# Patient Record
Sex: Female | Born: 1950 | Race: White | Hispanic: No | Marital: Married | Smoking: Never smoker
Health system: Southern US, Community
[De-identification: ages and names within clinical notes are randomized; demographics above are authoritative.]

## PROBLEM LIST (undated history)

## (undated) DIAGNOSIS — R251 Tremor, unspecified: Secondary | ICD-10-CM

## (undated) DIAGNOSIS — M1990 Unspecified osteoarthritis, unspecified site: Secondary | ICD-10-CM

## (undated) DIAGNOSIS — M722 Plantar fascial fibromatosis: Secondary | ICD-10-CM

## (undated) DIAGNOSIS — E782 Mixed hyperlipidemia: Secondary | ICD-10-CM

## (undated) DIAGNOSIS — G43909 Migraine, unspecified, not intractable, without status migrainosus: Secondary | ICD-10-CM

## (undated) DIAGNOSIS — Z1371 Encounter for nonprocreative screening for genetic disease carrier status: Secondary | ICD-10-CM

## (undated) HISTORY — DX: Plantar fascial fibromatosis: M72.2

## (undated) HISTORY — DX: Mixed hyperlipidemia: E78.2

## (undated) HISTORY — DX: Tremor, unspecified: R25.1

## (undated) HISTORY — PX: CATARACT EXTRACTION: SUR2

## (undated) HISTORY — PX: BREAST BIOPSY: SHX20

## (undated) HISTORY — DX: Unspecified osteoarthritis, unspecified site: M19.90

## (undated) HISTORY — DX: Migraine, unspecified, not intractable, without status migrainosus: G43.909

---
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
## 1981-03-11 HISTORY — PX: LAPAROSCOPY: SHX197

## 1997-08-25 ENCOUNTER — Other Ambulatory Visit: Admission: RE | Admit: 1997-08-25 | Discharge: 1997-08-25 | Payer: Self-pay | Admitting: Physician Assistant

## 1998-08-28 ENCOUNTER — Other Ambulatory Visit: Admission: RE | Admit: 1998-08-28 | Discharge: 1998-08-28 | Payer: Self-pay | Admitting: Obstetrics and Gynecology

## 1999-08-13 ENCOUNTER — Encounter: Admission: RE | Admit: 1999-08-13 | Discharge: 1999-08-13 | Payer: Self-pay | Admitting: Obstetrics and Gynecology

## 1999-08-13 ENCOUNTER — Encounter: Payer: Self-pay | Admitting: Obstetrics and Gynecology

## 1999-10-02 ENCOUNTER — Other Ambulatory Visit: Admission: RE | Admit: 1999-10-02 | Discharge: 1999-10-02 | Payer: Self-pay | Admitting: Obstetrics and Gynecology

## 1999-12-11 ENCOUNTER — Encounter: Payer: Self-pay | Admitting: Obstetrics and Gynecology

## 1999-12-11 ENCOUNTER — Encounter: Admission: RE | Admit: 1999-12-11 | Discharge: 1999-12-11 | Payer: Self-pay | Admitting: Obstetrics and Gynecology

## 2000-09-03 ENCOUNTER — Other Ambulatory Visit: Admission: RE | Admit: 2000-09-03 | Discharge: 2000-09-03 | Payer: Self-pay | Admitting: Radiology

## 2000-09-03 ENCOUNTER — Encounter: Payer: Self-pay | Admitting: Obstetrics and Gynecology

## 2000-09-03 ENCOUNTER — Encounter (INDEPENDENT_AMBULATORY_CARE_PROVIDER_SITE_OTHER): Payer: Self-pay | Admitting: *Deleted

## 2000-09-03 ENCOUNTER — Encounter: Admission: RE | Admit: 2000-09-03 | Discharge: 2000-09-03 | Payer: Self-pay | Admitting: Obstetrics and Gynecology

## 2000-10-29 ENCOUNTER — Other Ambulatory Visit: Admission: RE | Admit: 2000-10-29 | Discharge: 2000-10-29 | Payer: Self-pay | Admitting: Obstetrics and Gynecology

## 2001-11-16 ENCOUNTER — Other Ambulatory Visit: Admission: RE | Admit: 2001-11-16 | Discharge: 2001-11-16 | Payer: Self-pay | Admitting: Obstetrics and Gynecology

## 2001-12-18 ENCOUNTER — Encounter: Admission: RE | Admit: 2001-12-18 | Discharge: 2001-12-18 | Payer: Self-pay | Admitting: Obstetrics and Gynecology

## 2001-12-18 ENCOUNTER — Encounter: Payer: Self-pay | Admitting: Obstetrics and Gynecology

## 2003-02-08 ENCOUNTER — Encounter: Admission: RE | Admit: 2003-02-08 | Discharge: 2003-02-08 | Payer: Self-pay | Admitting: Obstetrics and Gynecology

## 2003-02-17 ENCOUNTER — Other Ambulatory Visit: Admission: RE | Admit: 2003-02-17 | Discharge: 2003-02-17 | Payer: Self-pay | Admitting: Obstetrics and Gynecology

## 2004-04-30 ENCOUNTER — Ambulatory Visit (HOSPITAL_COMMUNITY): Admission: RE | Admit: 2004-04-30 | Discharge: 2004-04-30 | Payer: Self-pay | Admitting: Gastroenterology

## 2004-09-04 ENCOUNTER — Other Ambulatory Visit: Admission: RE | Admit: 2004-09-04 | Discharge: 2004-09-04 | Payer: Self-pay | Admitting: Obstetrics and Gynecology

## 2004-09-20 ENCOUNTER — Encounter: Admission: RE | Admit: 2004-09-20 | Discharge: 2004-09-20 | Payer: Self-pay | Admitting: Obstetrics and Gynecology

## 2005-10-28 ENCOUNTER — Encounter: Admission: RE | Admit: 2005-10-28 | Discharge: 2005-10-28 | Payer: Self-pay | Admitting: Obstetrics and Gynecology

## 2006-12-08 ENCOUNTER — Encounter: Admission: RE | Admit: 2006-12-08 | Discharge: 2006-12-08 | Payer: Self-pay | Admitting: Obstetrics and Gynecology

## 2008-01-11 ENCOUNTER — Encounter: Admission: RE | Admit: 2008-01-11 | Discharge: 2008-01-11 | Payer: Self-pay | Admitting: Obstetrics and Gynecology

## 2009-02-15 ENCOUNTER — Encounter: Admission: RE | Admit: 2009-02-15 | Discharge: 2009-02-15 | Payer: Self-pay | Admitting: Obstetrics and Gynecology

## 2009-08-19 ENCOUNTER — Emergency Department (HOSPITAL_COMMUNITY): Admission: EM | Admit: 2009-08-19 | Discharge: 2009-08-19 | Payer: Self-pay | Admitting: Emergency Medicine

## 2010-03-31 ENCOUNTER — Other Ambulatory Visit: Payer: Self-pay | Admitting: Obstetrics and Gynecology

## 2010-03-31 DIAGNOSIS — Z0000 Encounter for general adult medical examination without abnormal findings: Secondary | ICD-10-CM

## 2010-04-12 ENCOUNTER — Ambulatory Visit
Admission: RE | Admit: 2010-04-12 | Discharge: 2010-04-12 | Disposition: A | Discharge status: Home / Self Care | Payer: PRIVATE HEALTH INSURANCE | Source: Ambulatory Visit | Attending: Obstetrics and Gynecology | Admitting: Obstetrics and Gynecology

## 2010-04-12 DIAGNOSIS — Z0000 Encounter for general adult medical examination without abnormal findings: Secondary | ICD-10-CM

## 2010-05-28 LAB — GLUCOSE, CAPILLARY: Glucose-Capillary: 95 mg/dL (ref 70–99)

## 2010-05-28 LAB — URINALYSIS, ROUTINE W REFLEX MICROSCOPIC
Bilirubin Urine: NEGATIVE
Glucose, UA: NEGATIVE mg/dL
Ketones, ur: NEGATIVE mg/dL
Nitrite: NEGATIVE
Protein, ur: NEGATIVE mg/dL
Specific Gravity, Urine: 1.019 (ref 1.005–1.030)

## 2010-05-28 LAB — POCT I-STAT, CHEM 8
Calcium, Ion: 1.23 mmol/L (ref 1.12–1.32)
Creatinine, Ser: 0.8 mg/dL (ref 0.4–1.2)
Glucose, Bld: 97 mg/dL (ref 70–99)
Hemoglobin: 13.6 g/dL (ref 12.0–15.0)
Sodium: 142 mEq/L (ref 135–145)

## 2010-07-27 NOTE — Op Note (Signed)
Sharon Miller, Sharon Miller               ACCOUNT NO.:  [id number]   MEDICAL RECORD NO.:  [id number]          PATIENT TYPE:  AMB   LOCATION:  ENDO                         FACILITY:  [facility]   PHYSICIAN:  Bernette Redbird, M.D.   DATE OF BIRTH:  1950/03/31   DATE OF PROCEDURE:  04/30/2004  DATE OF DISCHARGE:                                 OPERATIVE REPORT   PROCEDURE:  Colonoscopy.   ENDOSCOPIST:  Bernette Redbird, M.D.   INDICATION:  Followup of a solitary small adenomatous polyp removed  approximately seven years ago in a 60 year old female without worrisome  symptoms.   FINDINGS:  Sigmoid diverticulosis with mild fixation.   PROCEDURE:  The nature, purpose, and risks of the procedure were familiar to  the patient from prior examination, and she provided written consent.  Sedation was fentanyl 50 mcg and Versed 7 mg IV plus Phenergan 12.5 mg IV  because of a prior history of some nausea following surgical procedures.   The Olympus adjustable-tension pediatric video colonoscope was advanced to  the cecum without undue difficulty, using some external abdominal  compression to control looping.  The cecum was identified by visualization  of the appendiceal orifice, and pullback was then performed.  The quality of  the prep was very good, and it is felt that all areas were well seen.   In the sigmoid region, there was some diverticular change with perhaps a  little bit of fixation and muscular thickening, but no polyps, cancer,  colitis, or vascular malformations were seen in the colon. Retroflexion the  rectum and reinspection of the rectum were unremarkable.  No biopsies were  obtained.  She tolerated the procedure well, and there no apparent  complications.   IMPRESSION:  Prior history of colonic adenoma without worrisome findings on  current examination (V12.72).   PLAN:  Repeat colonoscopy in five years in view of the prior history of an  adenoma having been removed.      RB/MEDQ   D:  04/30/2004  T:  04/30/2004  Job:  [id number]   cc:   Talmadge Coventry, M.D.  [address]  [place]  [place] [address]  Fax: [phone]   Juluis Mire, M.D.  [address] [place]  [place] [address]  Fax: [phone]

## 2011-05-01 ENCOUNTER — Other Ambulatory Visit: Payer: Self-pay | Admitting: Obstetrics and Gynecology

## 2011-05-01 DIAGNOSIS — Z1231 Encounter for screening mammogram for malignant neoplasm of breast: Secondary | ICD-10-CM

## 2011-05-28 ENCOUNTER — Ambulatory Visit
Admission: RE | Admit: 2011-05-28 | Discharge: 2011-05-28 | Disposition: A | Discharge status: Home / Self Care | Payer: PRIVATE HEALTH INSURANCE | Source: Ambulatory Visit | Attending: Obstetrics and Gynecology | Admitting: Obstetrics and Gynecology

## 2011-05-28 DIAGNOSIS — Z1231 Encounter for screening mammogram for malignant neoplasm of breast: Secondary | ICD-10-CM

## 2012-06-02 ENCOUNTER — Other Ambulatory Visit: Payer: Self-pay

## 2012-06-02 DIAGNOSIS — Z1231 Encounter for screening mammogram for malignant neoplasm of breast: Secondary | ICD-10-CM

## 2012-06-23 ENCOUNTER — Ambulatory Visit
Admission: RE | Admit: 2012-06-23 | Discharge: 2012-06-23 | Disposition: A | Discharge status: Home / Self Care | Payer: 59 | Source: Ambulatory Visit

## 2012-06-23 DIAGNOSIS — Z1231 Encounter for screening mammogram for malignant neoplasm of breast: Secondary | ICD-10-CM

## 2013-06-29 ENCOUNTER — Other Ambulatory Visit: Payer: Self-pay

## 2013-06-29 DIAGNOSIS — Z1231 Encounter for screening mammogram for malignant neoplasm of breast: Secondary | ICD-10-CM

## 2013-07-13 ENCOUNTER — Ambulatory Visit
Admission: RE | Admit: 2013-07-13 | Discharge: 2013-07-13 | Disposition: A | Discharge status: Home / Self Care | Payer: 59 | Source: Ambulatory Visit

## 2013-07-13 ENCOUNTER — Encounter (INDEPENDENT_AMBULATORY_CARE_PROVIDER_SITE_OTHER): Payer: Self-pay

## 2013-07-13 DIAGNOSIS — Z1231 Encounter for screening mammogram for malignant neoplasm of breast: Secondary | ICD-10-CM

## 2014-07-29 ENCOUNTER — Other Ambulatory Visit: Payer: Self-pay

## 2014-07-29 DIAGNOSIS — Z1231 Encounter for screening mammogram for malignant neoplasm of breast: Secondary | ICD-10-CM

## 2014-08-30 ENCOUNTER — Ambulatory Visit
Admission: RE | Admit: 2014-08-30 | Discharge: 2014-08-30 | Disposition: A | Discharge status: Home / Self Care | Payer: 59 | Source: Ambulatory Visit

## 2014-08-30 DIAGNOSIS — Z1231 Encounter for screening mammogram for malignant neoplasm of breast: Secondary | ICD-10-CM

## 2015-04-24 ENCOUNTER — Encounter (HOSPITAL_COMMUNITY): Payer: 59

## 2015-08-09 ENCOUNTER — Other Ambulatory Visit: Payer: Self-pay | Admitting: Obstetrics and Gynecology

## 2015-08-09 ENCOUNTER — Other Ambulatory Visit: Payer: Self-pay

## 2015-08-09 DIAGNOSIS — Z1231 Encounter for screening mammogram for malignant neoplasm of breast: Secondary | ICD-10-CM

## 2015-09-05 ENCOUNTER — Ambulatory Visit: Payer: 59

## 2015-09-19 ENCOUNTER — Ambulatory Visit: Payer: 59

## 2015-10-04 ENCOUNTER — Ambulatory Visit
Admission: RE | Admit: 2015-10-04 | Discharge: 2015-10-04 | Disposition: A | Discharge status: Home / Self Care | Payer: 59 | Source: Ambulatory Visit | Attending: Obstetrics and Gynecology | Admitting: Obstetrics and Gynecology

## 2015-10-04 DIAGNOSIS — Z1231 Encounter for screening mammogram for malignant neoplasm of breast: Secondary | ICD-10-CM

## 2016-12-23 DIAGNOSIS — Z23 Encounter for immunization: Secondary | ICD-10-CM | POA: Diagnosis not present

## 2017-03-24 DIAGNOSIS — H2513 Age-related nuclear cataract, bilateral: Secondary | ICD-10-CM | POA: Diagnosis not present

## 2017-04-03 DIAGNOSIS — Z0000 Encounter for general adult medical examination without abnormal findings: Secondary | ICD-10-CM | POA: Diagnosis not present

## 2017-04-03 DIAGNOSIS — M1990 Unspecified osteoarthritis, unspecified site: Secondary | ICD-10-CM | POA: Diagnosis not present

## 2017-04-03 DIAGNOSIS — Z23 Encounter for immunization: Secondary | ICD-10-CM | POA: Diagnosis not present

## 2017-04-03 DIAGNOSIS — E663 Overweight: Secondary | ICD-10-CM | POA: Diagnosis not present

## 2017-04-03 DIAGNOSIS — Z79899 Other long term (current) drug therapy: Secondary | ICD-10-CM | POA: Diagnosis not present

## 2017-04-03 DIAGNOSIS — L570 Actinic keratosis: Secondary | ICD-10-CM | POA: Diagnosis not present

## 2017-04-03 DIAGNOSIS — E782 Mixed hyperlipidemia: Secondary | ICD-10-CM | POA: Diagnosis not present

## 2017-04-03 DIAGNOSIS — Z8669 Personal history of other diseases of the nervous system and sense organs: Secondary | ICD-10-CM | POA: Diagnosis not present

## 2017-04-03 DIAGNOSIS — G250 Essential tremor: Secondary | ICD-10-CM | POA: Diagnosis not present

## 2017-04-03 DIAGNOSIS — Z6828 Body mass index (BMI) 28.0-28.9, adult: Secondary | ICD-10-CM | POA: Diagnosis not present

## 2017-04-08 ENCOUNTER — Other Ambulatory Visit: Payer: Self-pay | Admitting: Obstetrics and Gynecology

## 2017-04-08 DIAGNOSIS — Z1231 Encounter for screening mammogram for malignant neoplasm of breast: Secondary | ICD-10-CM

## 2017-04-29 ENCOUNTER — Ambulatory Visit
Admission: RE | Admit: 2017-04-29 | Discharge: 2017-04-29 | Disposition: A | Discharge status: Home / Self Care | Payer: Medicare Other | Source: Ambulatory Visit | Attending: Obstetrics and Gynecology | Admitting: Obstetrics and Gynecology

## 2017-04-29 DIAGNOSIS — Z1231 Encounter for screening mammogram for malignant neoplasm of breast: Secondary | ICD-10-CM | POA: Diagnosis not present

## 2017-05-05 DIAGNOSIS — Z124 Encounter for screening for malignant neoplasm of cervix: Secondary | ICD-10-CM | POA: Diagnosis not present

## 2017-05-05 DIAGNOSIS — Z779 Other contact with and (suspected) exposures hazardous to health: Secondary | ICD-10-CM | POA: Diagnosis not present

## 2017-05-05 DIAGNOSIS — N3281 Overactive bladder: Secondary | ICD-10-CM | POA: Diagnosis not present

## 2017-05-05 DIAGNOSIS — Z6828 Body mass index (BMI) 28.0-28.9, adult: Secondary | ICD-10-CM | POA: Diagnosis not present

## 2017-06-16 DIAGNOSIS — D225 Melanocytic nevi of trunk: Secondary | ICD-10-CM | POA: Diagnosis not present

## 2017-06-16 DIAGNOSIS — D1801 Hemangioma of skin and subcutaneous tissue: Secondary | ICD-10-CM | POA: Diagnosis not present

## 2017-06-16 DIAGNOSIS — L821 Other seborrheic keratosis: Secondary | ICD-10-CM | POA: Diagnosis not present

## 2017-06-26 ENCOUNTER — Other Ambulatory Visit: Payer: Self-pay

## 2017-06-26 DIAGNOSIS — R05 Cough: Secondary | ICD-10-CM | POA: Diagnosis not present

## 2017-11-24 DIAGNOSIS — Z23 Encounter for immunization: Secondary | ICD-10-CM | POA: Diagnosis not present

## 2017-12-24 DIAGNOSIS — M5412 Radiculopathy, cervical region: Secondary | ICD-10-CM | POA: Diagnosis not present

## 2017-12-24 DIAGNOSIS — M5416 Radiculopathy, lumbar region: Secondary | ICD-10-CM | POA: Diagnosis not present

## 2018-01-28 DIAGNOSIS — N958 Other specified menopausal and perimenopausal disorders: Secondary | ICD-10-CM | POA: Diagnosis not present

## 2018-04-06 ENCOUNTER — Other Ambulatory Visit: Payer: Self-pay | Admitting: Obstetrics and Gynecology

## 2018-04-06 DIAGNOSIS — Z1231 Encounter for screening mammogram for malignant neoplasm of breast: Secondary | ICD-10-CM

## 2018-05-05 ENCOUNTER — Ambulatory Visit
Admission: RE | Admit: 2018-05-05 | Discharge: 2018-05-05 | Disposition: A | Discharge status: Home / Self Care | Payer: Medicare Other | Source: Ambulatory Visit | Attending: Obstetrics and Gynecology | Admitting: Obstetrics and Gynecology

## 2018-05-05 DIAGNOSIS — Z1231 Encounter for screening mammogram for malignant neoplasm of breast: Secondary | ICD-10-CM

## 2018-05-06 ENCOUNTER — Other Ambulatory Visit: Payer: Self-pay | Admitting: Obstetrics and Gynecology

## 2018-05-06 DIAGNOSIS — R928 Other abnormal and inconclusive findings on diagnostic imaging of breast: Secondary | ICD-10-CM

## 2018-05-07 DIAGNOSIS — Z86010 Personal history of colonic polyps: Secondary | ICD-10-CM | POA: Diagnosis not present

## 2018-05-07 DIAGNOSIS — Z0000 Encounter for general adult medical examination without abnormal findings: Secondary | ICD-10-CM | POA: Diagnosis not present

## 2018-05-07 DIAGNOSIS — G250 Essential tremor: Secondary | ICD-10-CM | POA: Diagnosis not present

## 2018-05-07 DIAGNOSIS — R8290 Unspecified abnormal findings in urine: Secondary | ICD-10-CM | POA: Diagnosis not present

## 2018-05-07 DIAGNOSIS — Z79899 Other long term (current) drug therapy: Secondary | ICD-10-CM | POA: Diagnosis not present

## 2018-05-07 DIAGNOSIS — Z1159 Encounter for screening for other viral diseases: Secondary | ICD-10-CM | POA: Diagnosis not present

## 2018-05-07 DIAGNOSIS — L570 Actinic keratosis: Secondary | ICD-10-CM | POA: Diagnosis not present

## 2018-05-07 DIAGNOSIS — Z8669 Personal history of other diseases of the nervous system and sense organs: Secondary | ICD-10-CM | POA: Diagnosis not present

## 2018-05-07 DIAGNOSIS — M1990 Unspecified osteoarthritis, unspecified site: Secondary | ICD-10-CM | POA: Diagnosis not present

## 2018-05-07 DIAGNOSIS — E782 Mixed hyperlipidemia: Secondary | ICD-10-CM | POA: Diagnosis not present

## 2018-05-11 ENCOUNTER — Ambulatory Visit
Admission: RE | Admit: 2018-05-11 | Discharge: 2018-05-11 | Disposition: A | Discharge status: Home / Self Care | Payer: Medicare Other | Source: Ambulatory Visit | Attending: Obstetrics and Gynecology | Admitting: Obstetrics and Gynecology

## 2018-05-11 ENCOUNTER — Ambulatory Visit: Payer: Medicare Other

## 2018-05-11 DIAGNOSIS — R928 Other abnormal and inconclusive findings on diagnostic imaging of breast: Secondary | ICD-10-CM

## 2018-05-12 DIAGNOSIS — Z01419 Encounter for gynecological examination (general) (routine) without abnormal findings: Secondary | ICD-10-CM | POA: Diagnosis not present

## 2018-05-12 DIAGNOSIS — Z6828 Body mass index (BMI) 28.0-28.9, adult: Secondary | ICD-10-CM | POA: Diagnosis not present

## 2018-05-12 DIAGNOSIS — Z124 Encounter for screening for malignant neoplasm of cervix: Secondary | ICD-10-CM | POA: Diagnosis not present

## 2018-11-29 DIAGNOSIS — Z23 Encounter for immunization: Secondary | ICD-10-CM | POA: Diagnosis not present

## 2018-12-24 DIAGNOSIS — H2513 Age-related nuclear cataract, bilateral: Secondary | ICD-10-CM | POA: Diagnosis not present

## 2019-01-04 DIAGNOSIS — D225 Melanocytic nevi of trunk: Secondary | ICD-10-CM | POA: Diagnosis not present

## 2019-01-04 DIAGNOSIS — Z1283 Encounter for screening for malignant neoplasm of skin: Secondary | ICD-10-CM | POA: Diagnosis not present

## 2019-04-22 ENCOUNTER — Ambulatory Visit: Payer: Medicare Other

## 2019-05-04 DIAGNOSIS — E782 Mixed hyperlipidemia: Secondary | ICD-10-CM | POA: Diagnosis not present

## 2019-05-04 DIAGNOSIS — M1990 Unspecified osteoarthritis, unspecified site: Secondary | ICD-10-CM | POA: Diagnosis not present

## 2019-05-21 DIAGNOSIS — E782 Mixed hyperlipidemia: Secondary | ICD-10-CM | POA: Diagnosis not present

## 2019-05-21 DIAGNOSIS — Z0000 Encounter for general adult medical examination without abnormal findings: Secondary | ICD-10-CM | POA: Diagnosis not present

## 2019-05-21 DIAGNOSIS — M1990 Unspecified osteoarthritis, unspecified site: Secondary | ICD-10-CM | POA: Diagnosis not present

## 2019-05-21 DIAGNOSIS — R8290 Unspecified abnormal findings in urine: Secondary | ICD-10-CM | POA: Diagnosis not present

## 2019-05-21 DIAGNOSIS — Z8669 Personal history of other diseases of the nervous system and sense organs: Secondary | ICD-10-CM | POA: Diagnosis not present

## 2019-05-21 DIAGNOSIS — L570 Actinic keratosis: Secondary | ICD-10-CM | POA: Diagnosis not present

## 2019-05-21 DIAGNOSIS — G250 Essential tremor: Secondary | ICD-10-CM | POA: Diagnosis not present

## 2019-05-21 DIAGNOSIS — Z79899 Other long term (current) drug therapy: Secondary | ICD-10-CM | POA: Diagnosis not present

## 2019-05-21 DIAGNOSIS — Z86010 Personal history of colonic polyps: Secondary | ICD-10-CM | POA: Diagnosis not present

## 2019-06-21 DIAGNOSIS — R350 Frequency of micturition: Secondary | ICD-10-CM | POA: Diagnosis not present

## 2019-06-21 DIAGNOSIS — Z01419 Encounter for gynecological examination (general) (routine) without abnormal findings: Secondary | ICD-10-CM | POA: Diagnosis not present

## 2019-06-21 DIAGNOSIS — N390 Urinary tract infection, site not specified: Secondary | ICD-10-CM | POA: Diagnosis not present

## 2019-06-21 DIAGNOSIS — Z779 Other contact with and (suspected) exposures hazardous to health: Secondary | ICD-10-CM | POA: Diagnosis not present

## 2019-06-21 DIAGNOSIS — Z6828 Body mass index (BMI) 28.0-28.9, adult: Secondary | ICD-10-CM | POA: Diagnosis not present

## 2019-06-22 ENCOUNTER — Other Ambulatory Visit: Payer: Self-pay | Admitting: Obstetrics and Gynecology

## 2019-06-22 DIAGNOSIS — Z1231 Encounter for screening mammogram for malignant neoplasm of breast: Secondary | ICD-10-CM

## 2019-07-22 ENCOUNTER — Ambulatory Visit
Admission: RE | Admit: 2019-07-22 | Discharge: 2019-07-22 | Disposition: A | Discharge status: Home / Self Care | Payer: Medicare Other | Source: Ambulatory Visit | Attending: Obstetrics and Gynecology | Admitting: Obstetrics and Gynecology

## 2019-07-22 ENCOUNTER — Other Ambulatory Visit: Payer: Self-pay

## 2019-07-22 DIAGNOSIS — Z1231 Encounter for screening mammogram for malignant neoplasm of breast: Secondary | ICD-10-CM

## 2019-08-11 DIAGNOSIS — R350 Frequency of micturition: Secondary | ICD-10-CM | POA: Diagnosis not present

## 2019-08-11 DIAGNOSIS — R351 Nocturia: Secondary | ICD-10-CM | POA: Diagnosis not present

## 2019-08-11 DIAGNOSIS — R3914 Feeling of incomplete bladder emptying: Secondary | ICD-10-CM | POA: Diagnosis not present

## 2019-08-11 DIAGNOSIS — N393 Stress incontinence (female) (male): Secondary | ICD-10-CM | POA: Diagnosis not present

## 2019-08-11 DIAGNOSIS — N952 Postmenopausal atrophic vaginitis: Secondary | ICD-10-CM | POA: Diagnosis not present

## 2019-09-22 DIAGNOSIS — R350 Frequency of micturition: Secondary | ICD-10-CM | POA: Diagnosis not present

## 2019-09-22 DIAGNOSIS — R3914 Feeling of incomplete bladder emptying: Secondary | ICD-10-CM | POA: Diagnosis not present

## 2019-09-22 DIAGNOSIS — R351 Nocturia: Secondary | ICD-10-CM | POA: Diagnosis not present

## 2019-09-22 DIAGNOSIS — M6281 Muscle weakness (generalized): Secondary | ICD-10-CM | POA: Diagnosis not present

## 2019-09-22 DIAGNOSIS — M62838 Other muscle spasm: Secondary | ICD-10-CM | POA: Diagnosis not present

## 2019-09-22 DIAGNOSIS — M6289 Other specified disorders of muscle: Secondary | ICD-10-CM | POA: Diagnosis not present

## 2019-10-08 DIAGNOSIS — S20212A Contusion of left front wall of thorax, initial encounter: Secondary | ICD-10-CM | POA: Diagnosis not present

## 2019-10-08 DIAGNOSIS — R0781 Pleurodynia: Secondary | ICD-10-CM | POA: Diagnosis not present

## 2019-10-08 DIAGNOSIS — S299XXA Unspecified injury of thorax, initial encounter: Secondary | ICD-10-CM | POA: Diagnosis not present

## 2019-10-20 DIAGNOSIS — R3914 Feeling of incomplete bladder emptying: Secondary | ICD-10-CM | POA: Diagnosis not present

## 2019-10-20 DIAGNOSIS — R350 Frequency of micturition: Secondary | ICD-10-CM | POA: Diagnosis not present

## 2019-10-20 DIAGNOSIS — M62838 Other muscle spasm: Secondary | ICD-10-CM | POA: Diagnosis not present

## 2019-10-20 DIAGNOSIS — R3915 Urgency of urination: Secondary | ICD-10-CM | POA: Diagnosis not present

## 2019-10-20 DIAGNOSIS — N393 Stress incontinence (female) (male): Secondary | ICD-10-CM | POA: Diagnosis not present

## 2019-10-20 DIAGNOSIS — R3911 Hesitancy of micturition: Secondary | ICD-10-CM | POA: Diagnosis not present

## 2019-10-20 DIAGNOSIS — M6289 Other specified disorders of muscle: Secondary | ICD-10-CM | POA: Diagnosis not present

## 2019-10-20 DIAGNOSIS — M6281 Muscle weakness (generalized): Secondary | ICD-10-CM | POA: Diagnosis not present

## 2019-11-17 DIAGNOSIS — N393 Stress incontinence (female) (male): Secondary | ICD-10-CM | POA: Diagnosis not present

## 2019-11-17 DIAGNOSIS — R3914 Feeling of incomplete bladder emptying: Secondary | ICD-10-CM | POA: Diagnosis not present

## 2019-11-17 DIAGNOSIS — R8271 Bacteriuria: Secondary | ICD-10-CM | POA: Diagnosis not present

## 2019-12-08 DIAGNOSIS — Z23 Encounter for immunization: Secondary | ICD-10-CM | POA: Diagnosis not present

## 2019-12-17 IMAGING — MG DIGITAL DIAGNOSTIC UNILATERAL LEFT MAMMOGRAM WITH TOMO AND CAD
4 series · 4 of 12 positions shown · non-contrast
Comparison: Previous exam(s).

CLINICAL DATA: Callback from screening mammogram for possible
asymmetry left breast.

EXAM:
DIGITAL DIAGNOSTIC UNILATERAL LEFT MAMMOGRAM WITH CAD AND TOMO

[L CC synth-2D]
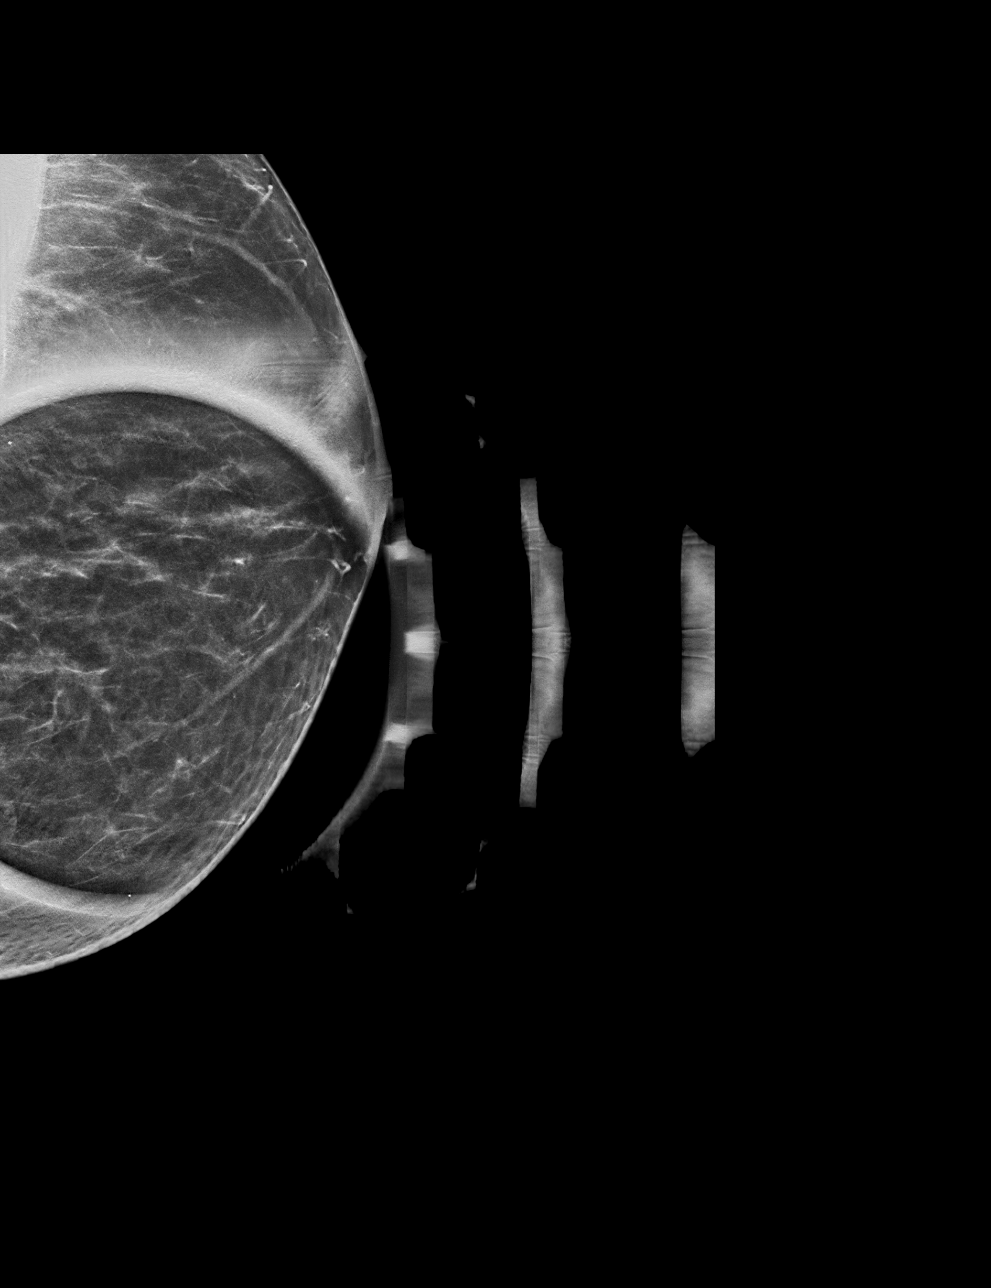

[L ML synth-2D]
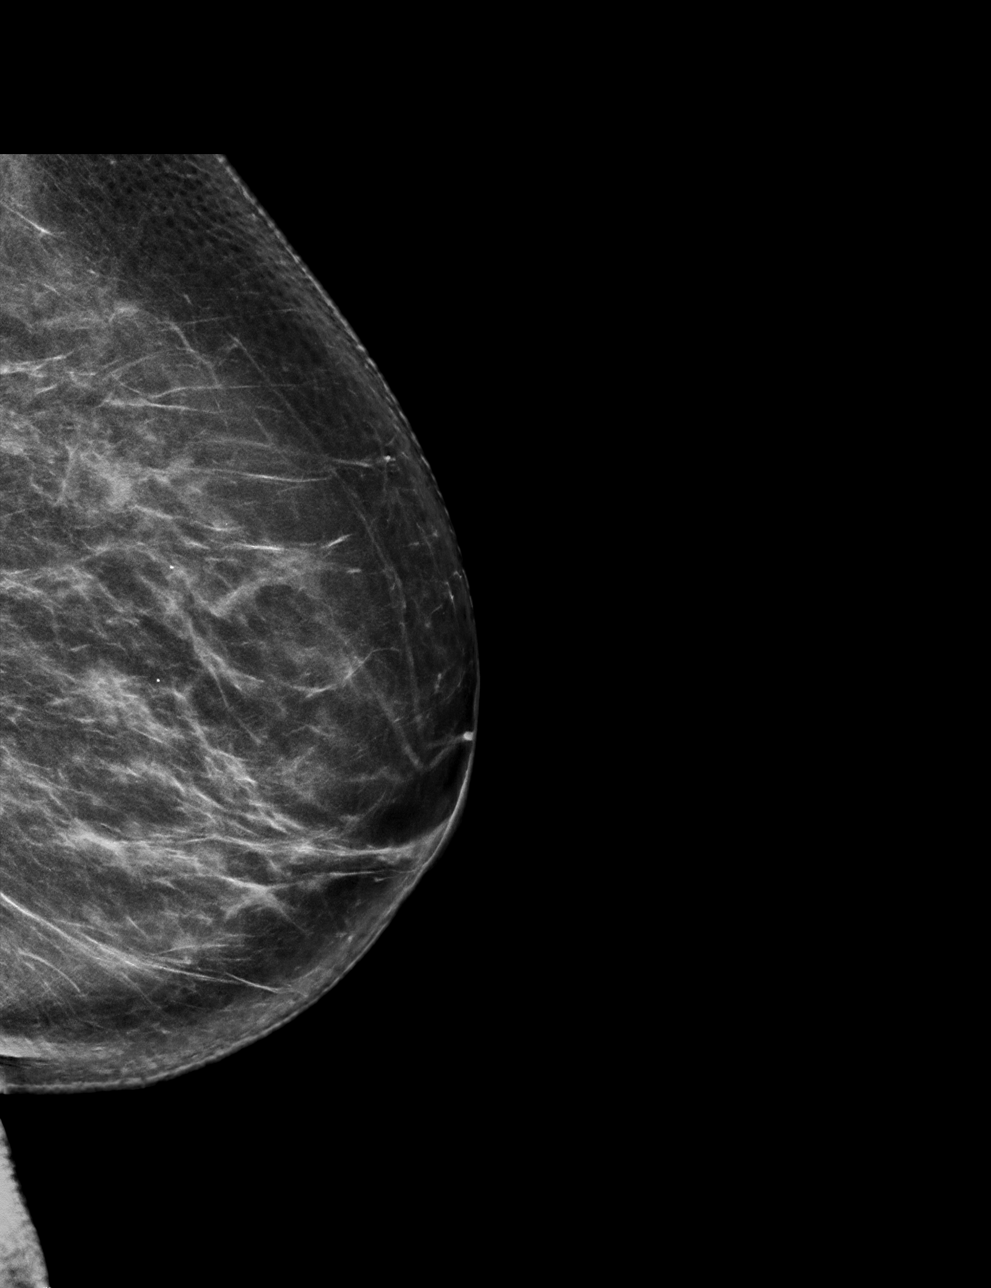

[L ML tomo · tomo slice 41/81.0]
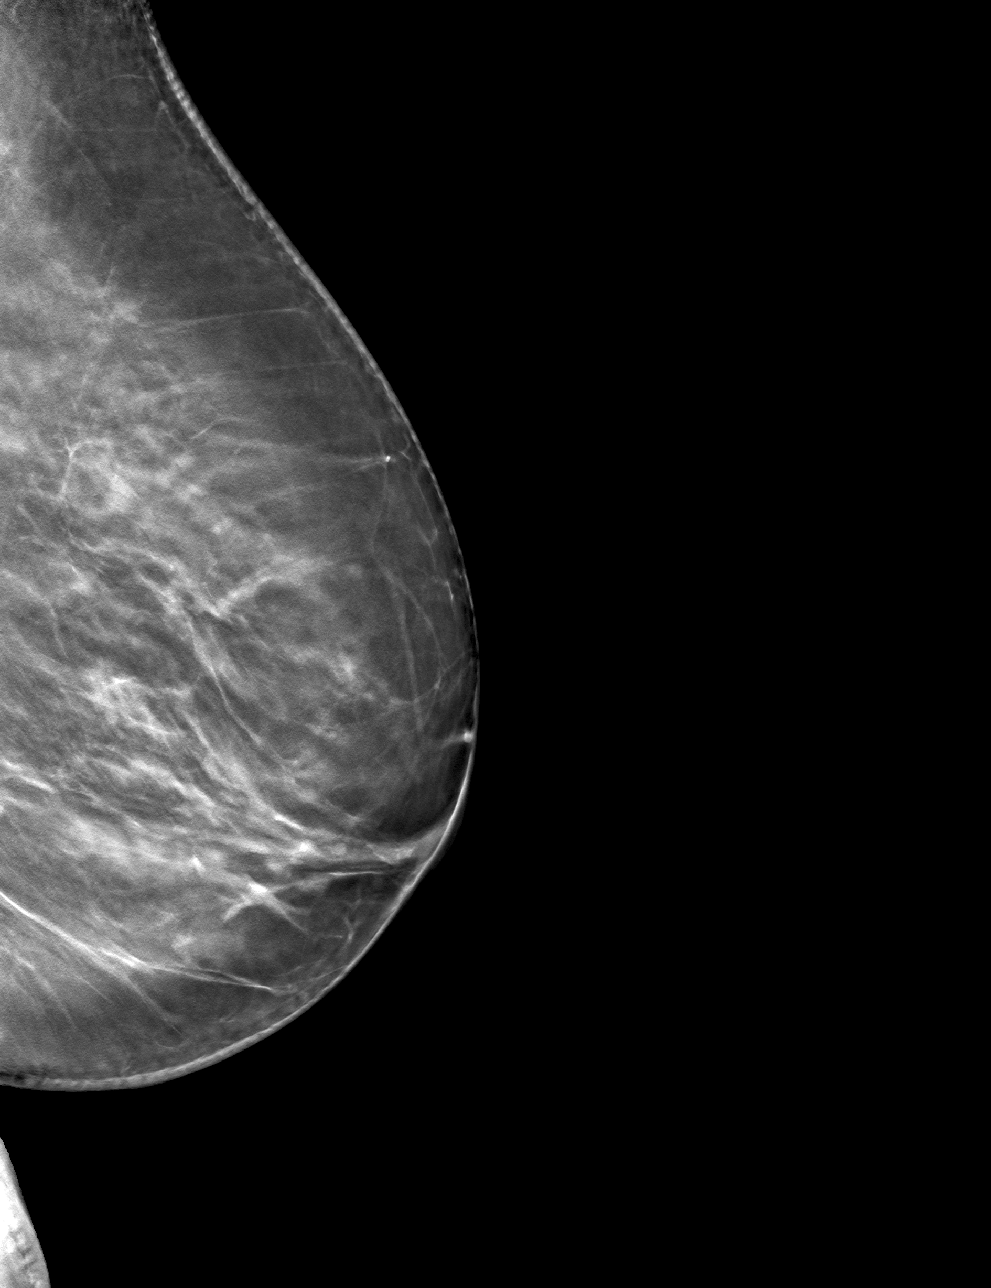

[L CC tomo · tomo slice 29/58.0]
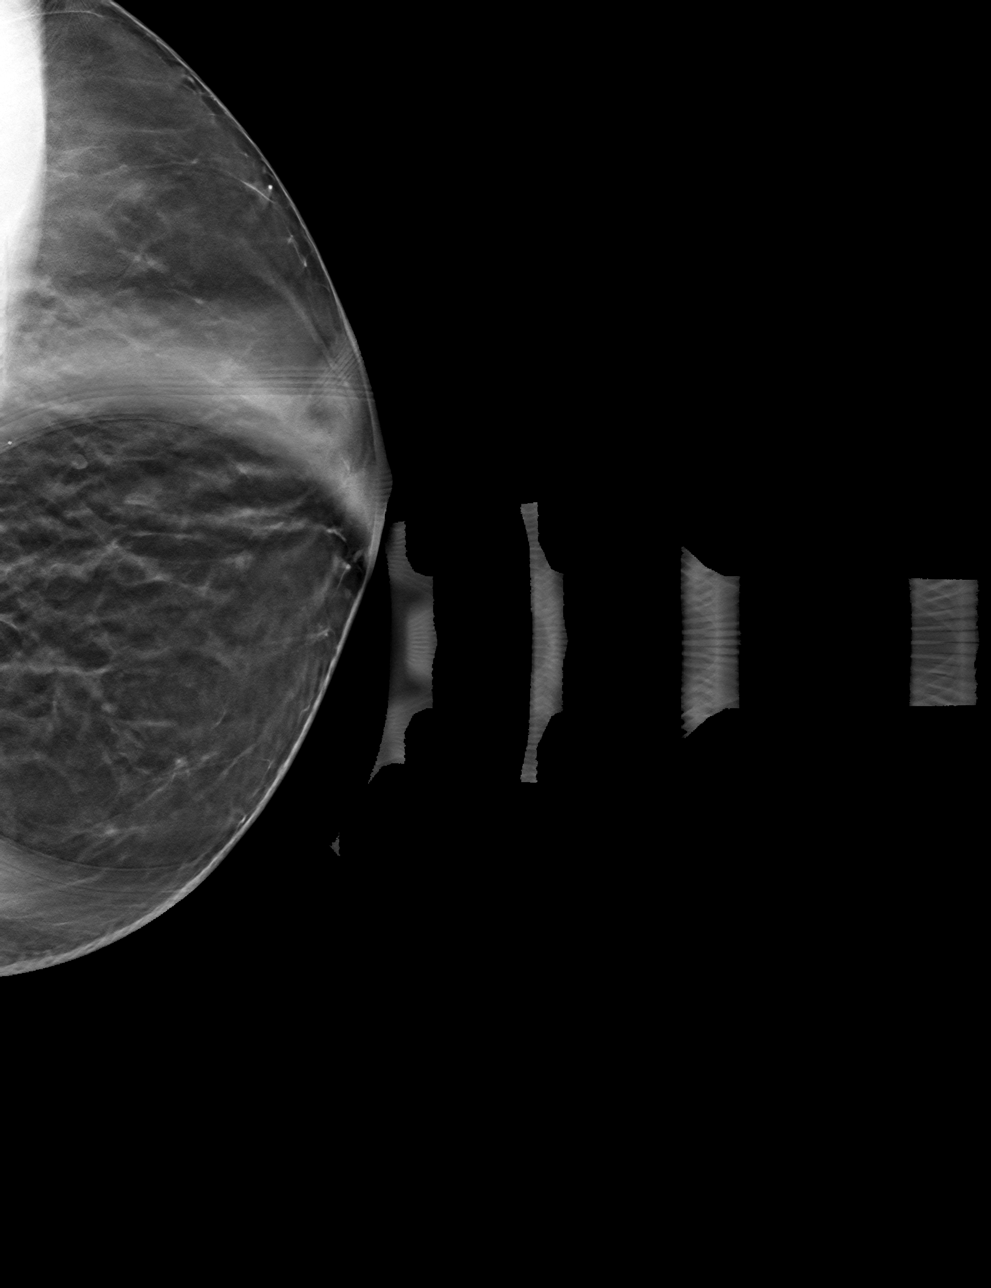

[4 of 12 positions shown; findings below may reference images not displayed]

ACR Breast Density Category b: There are scattered areas of
fibroglandular density.
FINDINGS: Lateral view left breast, spot compression left CC view are
submitted. Previously questioned asymmetry in medial left breast
does not persist on additional views. No suspicious is identified.

Mammographic images were processed with CAD.
IMPRESSION: Negative.

RECOMMENDATION:
Routine screening mammogram back on schedule.

I have discussed the findings and recommendations with the patient.
Results were also provided in writing at the conclusion of the
visit. If applicable, a reminder letter will be sent to the patient
regarding the next appointment.

BI-RADS CATEGORY  1: Negative.

## 2019-12-22 DIAGNOSIS — Z23 Encounter for immunization: Secondary | ICD-10-CM | POA: Diagnosis not present

## 2020-01-24 DIAGNOSIS — N952 Postmenopausal atrophic vaginitis: Secondary | ICD-10-CM | POA: Diagnosis not present

## 2020-01-24 DIAGNOSIS — R3915 Urgency of urination: Secondary | ICD-10-CM | POA: Diagnosis not present

## 2020-04-17 DIAGNOSIS — L218 Other seborrheic dermatitis: Secondary | ICD-10-CM | POA: Diagnosis not present

## 2020-04-17 DIAGNOSIS — D1801 Hemangioma of skin and subcutaneous tissue: Secondary | ICD-10-CM | POA: Diagnosis not present

## 2020-04-17 DIAGNOSIS — L820 Inflamed seborrheic keratosis: Secondary | ICD-10-CM | POA: Diagnosis not present

## 2020-04-17 DIAGNOSIS — L821 Other seborrheic keratosis: Secondary | ICD-10-CM | POA: Diagnosis not present

## 2020-04-17 DIAGNOSIS — D225 Melanocytic nevi of trunk: Secondary | ICD-10-CM | POA: Diagnosis not present

## 2020-04-17 DIAGNOSIS — Z1283 Encounter for screening for malignant neoplasm of skin: Secondary | ICD-10-CM | POA: Diagnosis not present

## 2020-06-13 ENCOUNTER — Encounter: Payer: Self-pay | Admitting: *Deleted

## 2020-06-13 ENCOUNTER — Other Ambulatory Visit: Payer: Self-pay | Admitting: *Deleted

## 2020-06-14 ENCOUNTER — Ambulatory Visit (INDEPENDENT_AMBULATORY_CARE_PROVIDER_SITE_OTHER): Payer: Medicare Other | Admitting: Diagnostic Neuroimaging

## 2020-06-14 ENCOUNTER — Encounter: Payer: Self-pay | Admitting: Diagnostic Neuroimaging

## 2020-06-14 VITALS — BP 121/74 | HR 72 | Ht 65.0 in | Wt 170.6 lb

## 2020-06-14 DIAGNOSIS — G250 Essential tremor: Secondary | ICD-10-CM

## 2020-06-14 MED ORDER — PRIMIDONE 50 MG PO TABS: 25.0000 mg | ORAL_TABLET | Freq: Two times a day (BID) | ORAL | 5 refills | Status: DC

## 2020-06-14 NOTE — Progress Notes (Signed)
[facility]  PATIENT: Sharon Miller DOB: 03/09/1951  REFERRING CLINICIAN: Hulan Fess, MD HISTORY FROM: patient  REASON FOR VISIT: new consult    HISTORICAL  CHIEF COMPLAINT:  Chief Complaint  Patient presents with  . Worsening tremors    Rm 7 New Pt  "strong family history of tremors; hands shaking for many years; left getting much worse"    HISTORY OF PRESENT ILLNESS:   70 year old female here for evaluation of essential tremor.  Patient has had mild postural action tremor in upper extremities since high school.  She has strong family history of similar tremor in both of her parents and 5 siblings.  She has been on propranolol 80 mg/day for many years.  Symptoms have been progressing over time.  Her left hand is more affected than right.  She would like to try some other medications to help with tremor control.  She denies any resting tremor.  No gait or balance difficulty.  No numbness or tingling.  No headaches or vision changes.  No speech or swallowing difficulties.   REVIEW OF SYSTEMS: Full 14 system review of systems performed and negative with exception of: per HPI.  ALLERGIES: Allergies  Allergen Reactions  . Codeine Other (See Comments)    migraine    HOME MEDICATIONS: Outpatient Medications Prior to Visit  Medication Sig Dispense Refill  . atorvastatin (LIPITOR) 10 MG tablet atorvastatin 10 mg tablet  TAKE 1 TABLET BY MOUTH EVERY EVENING    . Cholecalciferol (D3 PO) Take 25 mcg by mouth daily.    . meloxicam (MOBIC) 15 MG tablet 15 mg as needed.    . propranolol ER (INDERAL LA) 80 MG 24 hr capsule Take 1 capsule by mouth daily.    . SUMAtriptan (IMITREX) 100 MG tablet sumatriptan 100 mg tablet  [place] EAS DIRECTEDAS NEED FOR MIGRAINES (Patient not taking: Reported on 06/14/2020)    . Cholecalciferol (VITAMIN D) 10 MCG/ML LIQD Vitamin D    . propranolol (INNOPRAN XL) 120 MG 24 hr capsule propranolol ER 120 mg capsule,24 hr,extended release      No facility-administered medications prior to visit.    PAST MEDICAL HISTORY: Past Medical History:  Diagnosis Date  . Arthritis   . Migraine   . Mixed dyslipidemia   . Plantar fasciitis   . Tremor     PAST SURGICAL HISTORY: Past Surgical History:  Procedure Laterality Date  . BREAST BIOPSY Left   . DENTAL SURGERY  08/1968   wisdom teeth  . LAPAROSCOPY  1983   D&C    FAMILY HISTORY: Family History  Problem Relation Age of Onset  . Other Mother        MVA  . Psoriasis Mother   . Tremor Mother   . Other Father        MVA  . Tremor Father   . Migraines Father   . Tremor Sister   . Migraines Sister   . Cancer Sister        skin  . Colitis Brother   . Cancer Brother        skin  . Tremor Brother   . Tremor Brother   . Tremor Sister   . Cancer Maternal Grandmother   . Heart attack Maternal Grandfather   . Cancer Paternal Grandmother   . Tremor Brother     SOCIAL HISTORY: Social History   Socioeconomic History  . Marital status: Married    Spouse name: Sonia Side  . Number of children: 3  .  Years of education: 59  . Highest education level: Not on file  Occupational History    Comment: Wellsite geologist, retired  Tobacco Use  . Smoking status: Never Smoker  . Smokeless tobacco: Never Used  Substance and Sexual Activity  . Alcohol use: Yes    Alcohol/week: 5.0 - 7.0 standard drinks    Types: 5 - 7 Glasses of wine per week    Comment: wine 1-2 a few times a week  . Drug use: Never  . Sexual activity: Not on file  Other Topics Concern  . Not on file  Social History Narrative   Lives with husband   caffeine- 1 c coffee daily   Social Determinants of Health   Financial Resource Strain: Not on file  Food Insecurity: Not on file  Transportation Needs: Not on file  Physical Activity: Not on file  Stress: Not on file  Social Connections: Not on file  Intimate Partner Violence: Not on file     PHYSICAL EXAM  GENERAL  EXAM/CONSTITUTIONAL: Vitals:  Vitals:   06/14/20 0817  BP: 121/74  Pulse: 72  Weight: 170 lb 9.6 oz (77.4 kg)  Height: 5\' 5"  (1.651 m)   Body mass index is 28.39 kg/m. Wt Readings from Last 3 Encounters:  06/14/20 170 lb 9.6 oz (77.4 kg)    Patient is in no distress; well developed, nourished and groomed; neck is supple  CARDIOVASCULAR:  Examination of carotid arteries is normal; no carotid bruits  Regular rate and rhythm, no murmurs  Examination of peripheral vascular system by observation and palpation is normal  EYES:  Ophthalmoscopic exam of optic discs and posterior segments is normal; no papilledema or hemorrhages No exam data present  MUSCULOSKELETAL:  Gait, strength, tone, movements noted in Neurologic exam below  NEUROLOGIC: MENTAL STATUS:  No flowsheet data found.  awake, alert, oriented to person, place and time  recent and remote memory intact  normal attention and concentration  language fluent, comprehension intact, naming intact  fund of knowledge appropriate  CRANIAL NERVE:   2nd - no papilledema on fundoscopic exam  2nd, 3rd, 4th, 6th - pupils equal and reactive to light, visual fields full to confrontation, extraocular muscles intact, no nystagmus  5th - facial sensation symmetric  7th - facial strength symmetric  8th - hearing intact  9th - palate elevates symmetrically, uvula midline  11th - shoulder shrug symmetric  12th - tongue protrusion midline  MOTOR:   POSTURAL AND ACTION TREMOR IN BUE (L > R)  normal bulk and tone, full strength in the BUE, BLE  SENSORY:   normal and symmetric to light touch, temperature, vibration  COORDINATION:   finger-nose-finger, fine finger movements normal  REFLEXES:   deep tendon reflexes present and symmetric  GAIT/STATION:   narrow based gait; able to walk on toes, heels and tandem; romberg is negative     DIAGNOSTIC DATA (LABS, IMAGING, TESTING) - I reviewed patient  records, labs, notes, testing and imaging myself where available.  Lab Results  Component Value Date   HGB 13.6 08/19/2009   HCT 40.0 08/19/2009      Component Value Date/Time   NA 142 08/19/2009 0654   K 5.0 08/19/2009 0654   CL 107 08/19/2009 0654   GLUCOSE 97 08/19/2009 0654   BUN 16 08/19/2009 0654   CREATININE 0.8 08/19/2009 0654   No results found for: CHOL, HDL, LDLCALC, LDLDIRECT, TRIG, CHOLHDL No results found for: HGBA1C No results found for: VITAMINB12 No results found  for: TSH     ASSESSMENT AND PLAN  70 y.o. 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  Dx:  1. Essential tremor     PLAN:  Essential tremor - continue propranolol ER 80mg  daily - start primidone 25mg  at bedtime; then increase up to 50mg  daily, then 50mg  twice a day - educated patient on deep brain stimulator options; I do not recommend this at this time but could consider in the future  Meds ordered this encounter  Medications  . primidone (MYSOLINE) 50 MG tablet    Sig: Take 0.5-1 tablets (25-50 mg total) by mouth in the morning and at bedtime.    Dispense:  60 tablet    Refill:  5   Return in about 1 year (around 06/14/2021).    Penni Bombard, MD 11/17/2427, 9:80 AM Certified in Neurology, Neurophysiology and Neuroimaging  [facility] [address], [place], [place] [address] [phone]

## 2020-06-14 NOTE — Patient Instructions (Signed)
Essential tremor - continue propranolol ER 80mg  daily - start primidone 25mg  at bedtime; then increase up to 50mg  daily, then 50mg  twice a day

## 2020-06-28 DIAGNOSIS — N390 Urinary tract infection, site not specified: Secondary | ICD-10-CM | POA: Diagnosis not present

## 2020-06-28 DIAGNOSIS — Z124 Encounter for screening for malignant neoplasm of cervix: Secondary | ICD-10-CM | POA: Diagnosis not present

## 2020-06-28 DIAGNOSIS — Z6828 Body mass index (BMI) 28.0-28.9, adult: Secondary | ICD-10-CM | POA: Diagnosis not present

## 2020-07-10 DIAGNOSIS — Z0000 Encounter for general adult medical examination without abnormal findings: Secondary | ICD-10-CM | POA: Diagnosis not present

## 2020-07-10 DIAGNOSIS — E7800 Pure hypercholesterolemia, unspecified: Secondary | ICD-10-CM | POA: Diagnosis not present

## 2020-07-10 DIAGNOSIS — Z23 Encounter for immunization: Secondary | ICD-10-CM | POA: Diagnosis not present

## 2020-07-10 DIAGNOSIS — G250 Essential tremor: Secondary | ICD-10-CM | POA: Diagnosis not present

## 2020-07-12 DIAGNOSIS — E7800 Pure hypercholesterolemia, unspecified: Secondary | ICD-10-CM | POA: Diagnosis not present

## 2020-07-12 DIAGNOSIS — Z0000 Encounter for general adult medical examination without abnormal findings: Secondary | ICD-10-CM | POA: Diagnosis not present

## 2020-07-12 DIAGNOSIS — G250 Essential tremor: Secondary | ICD-10-CM | POA: Diagnosis not present

## 2020-07-27 DIAGNOSIS — N9089 Other specified noninflammatory disorders of vulva and perineum: Secondary | ICD-10-CM | POA: Diagnosis not present

## 2020-08-09 ENCOUNTER — Other Ambulatory Visit: Payer: Self-pay | Admitting: Obstetrics and Gynecology

## 2020-08-09 DIAGNOSIS — Z1231 Encounter for screening mammogram for malignant neoplasm of breast: Secondary | ICD-10-CM

## 2020-08-10 ENCOUNTER — Ambulatory Visit
Admission: RE | Admit: 2020-08-10 | Discharge: 2020-08-10 | Disposition: A | Discharge status: Home / Self Care | Payer: Medicare Other | Source: Ambulatory Visit | Attending: Obstetrics and Gynecology | Admitting: Obstetrics and Gynecology

## 2020-08-10 ENCOUNTER — Other Ambulatory Visit: Payer: Self-pay

## 2020-08-10 DIAGNOSIS — Z1231 Encounter for screening mammogram for malignant neoplasm of breast: Secondary | ICD-10-CM

## 2020-08-14 DIAGNOSIS — R3915 Urgency of urination: Secondary | ICD-10-CM | POA: Diagnosis not present

## 2020-08-14 DIAGNOSIS — R8271 Bacteriuria: Secondary | ICD-10-CM | POA: Diagnosis not present

## 2020-08-30 DIAGNOSIS — N9089 Other specified noninflammatory disorders of vulva and perineum: Secondary | ICD-10-CM | POA: Diagnosis not present

## 2020-10-23 DIAGNOSIS — Z86010 Personal history of colonic polyps: Secondary | ICD-10-CM | POA: Diagnosis not present

## 2020-10-23 DIAGNOSIS — D122 Benign neoplasm of ascending colon: Secondary | ICD-10-CM | POA: Diagnosis not present

## 2020-10-23 DIAGNOSIS — K5730 Diverticulosis of large intestine without perforation or abscess without bleeding: Secondary | ICD-10-CM | POA: Diagnosis not present

## 2020-10-25 DIAGNOSIS — D122 Benign neoplasm of ascending colon: Secondary | ICD-10-CM | POA: Diagnosis not present

## 2020-11-22 DIAGNOSIS — Z23 Encounter for immunization: Secondary | ICD-10-CM | POA: Diagnosis not present

## 2020-12-11 DIAGNOSIS — Z23 Encounter for immunization: Secondary | ICD-10-CM | POA: Diagnosis not present

## 2021-01-09 ENCOUNTER — Ambulatory Visit: Payer: Self-pay | Admitting: Surgery

## 2021-01-09 DIAGNOSIS — G250 Essential tremor: Secondary | ICD-10-CM | POA: Diagnosis not present

## 2021-01-09 DIAGNOSIS — R198 Other specified symptoms and signs involving the digestive system and abdomen: Secondary | ICD-10-CM | POA: Diagnosis not present

## 2021-01-09 DIAGNOSIS — K642 Third degree hemorrhoids: Secondary | ICD-10-CM | POA: Diagnosis not present

## 2021-01-09 DIAGNOSIS — K648 Other hemorrhoids: Secondary | ICD-10-CM | POA: Diagnosis not present

## 2021-01-17 DIAGNOSIS — H2513 Age-related nuclear cataract, bilateral: Secondary | ICD-10-CM | POA: Diagnosis not present

## 2021-01-18 DIAGNOSIS — N760 Acute vaginitis: Secondary | ICD-10-CM | POA: Diagnosis not present

## 2021-01-18 DIAGNOSIS — N3000 Acute cystitis without hematuria: Secondary | ICD-10-CM | POA: Diagnosis not present

## 2021-02-11 DIAGNOSIS — U071 COVID-19: Secondary | ICD-10-CM | POA: Diagnosis not present

## 2021-02-13 DIAGNOSIS — R8271 Bacteriuria: Secondary | ICD-10-CM | POA: Diagnosis not present

## 2021-02-13 DIAGNOSIS — R3914 Feeling of incomplete bladder emptying: Secondary | ICD-10-CM | POA: Diagnosis not present

## 2021-02-13 DIAGNOSIS — N952 Postmenopausal atrophic vaginitis: Secondary | ICD-10-CM | POA: Diagnosis not present

## 2021-05-09 ENCOUNTER — Telehealth: Payer: Self-pay | Admitting: Diagnostic Neuroimaging

## 2021-05-09 DIAGNOSIS — U071 COVID-19: Secondary | ICD-10-CM | POA: Diagnosis not present

## 2021-05-09 NOTE — Telephone Encounter (Signed)
Lvm and sent mychart message to call the office back to reschedule appointment in April due to Dr. Leta Baptist being out of the office. ?

## 2021-06-05 DIAGNOSIS — L570 Actinic keratosis: Secondary | ICD-10-CM | POA: Diagnosis not present

## 2021-06-05 DIAGNOSIS — D0361 Melanoma in situ of right upper limb, including shoulder: Secondary | ICD-10-CM | POA: Diagnosis not present

## 2021-06-05 DIAGNOSIS — Z1283 Encounter for screening for malignant neoplasm of skin: Secondary | ICD-10-CM | POA: Diagnosis not present

## 2021-06-05 DIAGNOSIS — D225 Melanocytic nevi of trunk: Secondary | ICD-10-CM | POA: Diagnosis not present

## 2021-06-05 DIAGNOSIS — X32XXXD Exposure to sunlight, subsequent encounter: Secondary | ICD-10-CM | POA: Diagnosis not present

## 2021-06-05 DIAGNOSIS — L820 Inflamed seborrheic keratosis: Secondary | ICD-10-CM | POA: Diagnosis not present

## 2021-06-05 DIAGNOSIS — D2261 Melanocytic nevi of right upper limb, including shoulder: Secondary | ICD-10-CM | POA: Diagnosis not present

## 2021-06-19 ENCOUNTER — Ambulatory Visit: Payer: Medicare Other | Admitting: Diagnostic Neuroimaging

## 2021-06-26 DIAGNOSIS — D0361 Melanoma in situ of right upper limb, including shoulder: Secondary | ICD-10-CM | POA: Diagnosis not present

## 2021-06-26 DIAGNOSIS — L988 Other specified disorders of the skin and subcutaneous tissue: Secondary | ICD-10-CM | POA: Diagnosis not present

## 2021-06-27 ENCOUNTER — Other Ambulatory Visit: Payer: Self-pay | Admitting: Obstetrics and Gynecology

## 2021-06-27 DIAGNOSIS — Z1231 Encounter for screening mammogram for malignant neoplasm of breast: Secondary | ICD-10-CM

## 2021-07-18 DIAGNOSIS — E7800 Pure hypercholesterolemia, unspecified: Secondary | ICD-10-CM | POA: Diagnosis not present

## 2021-07-18 DIAGNOSIS — G43909 Migraine, unspecified, not intractable, without status migrainosus: Secondary | ICD-10-CM | POA: Diagnosis not present

## 2021-07-18 DIAGNOSIS — Z0000 Encounter for general adult medical examination without abnormal findings: Secondary | ICD-10-CM | POA: Diagnosis not present

## 2021-07-20 DIAGNOSIS — E7800 Pure hypercholesterolemia, unspecified: Secondary | ICD-10-CM | POA: Diagnosis not present

## 2021-07-23 ENCOUNTER — Encounter: Payer: Self-pay | Admitting: Diagnostic Neuroimaging

## 2021-07-23 ENCOUNTER — Ambulatory Visit (INDEPENDENT_AMBULATORY_CARE_PROVIDER_SITE_OTHER): Payer: Medicare Other | Admitting: Diagnostic Neuroimaging

## 2021-07-23 VITALS — BP 113/72 | HR 73 | Ht 65.0 in | Wt 170.0 lb

## 2021-07-23 DIAGNOSIS — D485 Neoplasm of uncertain behavior of skin: Secondary | ICD-10-CM | POA: Diagnosis not present

## 2021-07-23 DIAGNOSIS — L820 Inflamed seborrheic keratosis: Secondary | ICD-10-CM | POA: Diagnosis not present

## 2021-07-23 DIAGNOSIS — G250 Essential tremor: Secondary | ICD-10-CM

## 2021-07-23 MED ORDER — PRIMIDONE 50 MG PO TABS: 50.0000 mg | ORAL_TABLET | Freq: Every day | ORAL | 5 refills | Status: DC

## 2021-07-23 NOTE — Progress Notes (Signed)
? ?[facility] ? ?PATIENT: Sharon Miller ?DOB: 1950/12/21 ? ?REFERRING CLINICIAN: Hulan Fess, MD ?HISTORY FROM: patient  ?REASON FOR VISIT: follow up ? ? ?HISTORICAL ? ?CHIEF COMPLAINT:  ?Chief Complaint  ?Patient presents with  ? New Patient (Initial Visit)  ?  Rm 6 alone- reports tremors are worse than last f/u. Stopped primidone after a few days due to sx not improving. She would like to discuss medication management.   ? ? ?HISTORY OF PRESENT ILLNESS:  ? ?UPDATE (07/23/21, VRP): Since last visit, tried primidone for 1-2 months, no benefit, so weaned off. Since then tremor is worse. Wants to try again.  ? ?PRIOR HPI (06/14/20): 71 year old female here for evaluation of essential tremor.  Patient has had mild postural action tremor in upper extremities since high school.  She has strong family history of similar tremor in both of her parents and 5 siblings.  She has been on propranolol 80 mg/day for many years.  Symptoms have been progressing over time.  Her left hand is more affected than right.  She would like to try some other medications to help with tremor control. ? ?She denies any resting tremor.  No gait or balance difficulty.  No numbness or tingling.  No headaches or vision changes.  No speech or swallowing difficulties. ? ? ?REVIEW OF SYSTEMS: Full 14 system review of systems performed and negative with exception of: per HPI. ? ?ALLERGIES: ?Allergies  ?Allergen Reactions  ? Codeine Other (See Comments)  ?  migraine  ? ? ?HOME MEDICATIONS: ?Outpatient Medications Prior to Visit  ?Medication Sig Dispense Refill  ? atorvastatin (LIPITOR) 10 MG tablet atorvastatin 10 mg tablet ? TAKE 1 TABLET BY MOUTH EVERY EVENING    ? Cholecalciferol (D3 PO) Take 25 mcg by mouth daily.    ? meloxicam (MOBIC) 15 MG tablet 15 mg as needed.    ? propranolol ER (INDERAL LA) 80 MG 24 hr capsule Take 1 capsule by mouth daily.    ? SUMAtriptan (IMITREX) 100 MG tablet     ? primidone (MYSOLINE) 50 MG tablet  Take 0.5-1 tablets (25-50 mg total) by mouth in the morning and at bedtime. 60 tablet 5  ? ?No facility-administered medications prior to visit.  ? ? ?PAST MEDICAL HISTORY: ?Past Medical History:  ?Diagnosis Date  ? Arthritis   ? Migraine   ? Mixed dyslipidemia   ? Plantar fasciitis   ? Tremor   ? ? ?PAST SURGICAL HISTORY: ?Past Surgical History:  ?Procedure Laterality Date  ? BREAST BIOPSY Left   ? DENTAL SURGERY  08/1968  ? wisdom teeth  ? LAPAROSCOPY  1983  ? D&C  ? ? ?FAMILY HISTORY: ?Family History  ?Problem Relation Age of Onset  ? Other Mother   ?     MVA  ? Psoriasis Mother   ? Tremor Mother   ? Other Father   ?     MVA  ? Tremor Father   ? Migraines Father   ? Tremor Sister   ? Migraines Sister   ? Cancer Sister   ?     skin  ? Colitis Brother   ? Cancer Brother   ?     skin  ? Tremor Brother   ? Tremor Brother   ? Tremor Sister   ? Cancer Maternal Grandmother   ? Heart attack Maternal Grandfather   ? Cancer Paternal Grandmother   ? Tremor Brother   ? ? ?SOCIAL HISTORY: ?Social History  ? ?Socioeconomic  History  ? Marital status: Married  ?  Spouse name: Sonia Side  ? Number of children: 3  ? Years of education: 20  ? Highest education level: Not on file  ?Occupational History  ?  Comment: Wellsite geologist, retired  ?Tobacco Use  ? Smoking status: Never  ? Smokeless tobacco: Never  ?Substance and Sexual Activity  ? Alcohol use: Yes  ?  Alcohol/week: 5.0 - 7.0 standard drinks  ?  Types: 5 - 7 Glasses of wine per week  ?  Comment: wine 1-2 a few times a week  ? Drug use: Never  ? Sexual activity: Not on file  ?Other Topics Concern  ? Not on file  ?Social History Narrative  ? Lives with husband  ? caffeine- 1 c coffee daily  ? ?Social Determinants of Health  ? ?Financial Resource Strain: Not on file  ?Food Insecurity: Not on file  ?Transportation Needs: Not on file  ?Physical Activity: Not on file  ?Stress: Not on file  ?Social Connections: Not on file  ?Intimate Partner Violence: Not on file  ? ? ? ?PHYSICAL  EXAM ? ?GENERAL EXAM/CONSTITUTIONAL: ?Vitals:  ?Vitals:  ? 07/23/21 1555  ?BP: 113/72  ?Pulse: 73  ?Weight: 170 lb (77.1 kg)  ?Height: '5\' 5"'$  (1.651 m)  ? ?Body mass index is 28.29 kg/m?. ?Wt Readings from Last 3 Encounters:  ?07/23/21 170 lb (77.1 kg)  ?06/14/20 170 lb 9.6 oz (77.4 kg)  ? ?Patient is in no distress; well developed, nourished and groomed; neck is supple ? ?CARDIOVASCULAR: ?Examination of carotid arteries is normal; no carotid bruits ?Regular rate and rhythm, no murmurs ?Examination of peripheral vascular system by observation and palpation is normal ? ?EYES: ?Ophthalmoscopic exam of optic discs and posterior segments is normal; no papilledema or hemorrhages ?No results found. ? ?MUSCULOSKELETAL: ?Gait, strength, tone, movements noted in Neurologic exam below ? ?NEUROLOGIC: ?MENTAL STATUS:  ?   ? View : No data to display.  ?  ?  ?  ? ?awake, alert, oriented to person, place and time ?recent and remote memory intact ?normal attention and concentration ?language fluent, comprehension intact, naming intact ?fund of knowledge appropriate ? ?CRANIAL NERVE:  ?2nd - no papilledema on fundoscopic exam ?2nd, 3rd, 4th, 6th - pupils equal and reactive to light, visual fields full to confrontation, extraocular muscles intact, no nystagmus ?5th - facial sensation symmetric ?7th - facial strength symmetric ?8th - hearing intact ?9th - palate elevates symmetrically, uvula midline ?11th - shoulder shrug symmetric ?12th - tongue protrusion midline ? ?MOTOR:  ?POSTURAL AND ACTION TREMOR IN BUE (L > R) ?normal bulk and tone, full strength in the BUE, BLE ? ?SENSORY:  ?normal and symmetric to light touch, temperature, vibration ? ?COORDINATION:  ?finger-nose-finger, fine finger movements normal ? ?REFLEXES:  ?deep tendon reflexes present and symmetric ? ?GAIT/STATION:  ?narrow based gait ? ? ? ? ?DIAGNOSTIC DATA (LABS, IMAGING, TESTING) ?- I reviewed patient records, labs, notes, testing and imaging myself where  available. ? ?Lab Results  ?Component Value Date  ? HGB 13.6 08/19/2009  ? HCT 40.0 08/19/2009  ? ?   ?Component Value Date/Time  ? NA 142 08/19/2009 0654  ? K 5.0 08/19/2009 0654  ? CL 107 08/19/2009 0654  ? GLUCOSE 97 08/19/2009 0654  ? BUN 16 08/19/2009 0654  ? CREATININE 0.8 08/19/2009 0654  ? ?No results found for: CHOL, HDL, LDLCALC, LDLDIRECT, TRIG, CHOLHDL ?No results found for: HGBA1C ?No results found for: VITAMINB12 ?No results found for:  TSH ? ? ? ? ?ASSESSMENT AND PLAN ? ?71 y.o. 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 ? ?Dx: ? ?1. Essential tremor   ? ? ? ?PLAN: ? ?Essential tremor ?- continue propranolol ER '80mg'$  daily ?- restart primidone '25mg'$  at bedtime; then increase up to '50mg'$  at bedtime ?- educated patient on deep brain stimulator options; I do not recommend this at this time but could consider in the future ? ?Meds ordered this encounter  ?Medications  ? primidone (MYSOLINE) 50 MG tablet  ?  Sig: Take 1 tablet (50 mg total) by mouth at bedtime.  ?  Dispense:  30 tablet  ?  Refill:  5  ? ?Return in about 1 year (around 07/24/2022) for MyChart visit (15 min). ? ? ? ?Penni Bombard, MD 2/33/4356, 8:61 PM ?Certified in Neurology, Neurophysiology and Neuroimaging ? ?[facility] ?[address], [address] ?[place],  [address] ?[phone] ? ?

## 2021-07-31 DIAGNOSIS — Z23 Encounter for immunization: Secondary | ICD-10-CM | POA: Diagnosis not present

## 2021-08-13 DIAGNOSIS — Z6828 Body mass index (BMI) 28.0-28.9, adult: Secondary | ICD-10-CM | POA: Diagnosis not present

## 2021-08-13 DIAGNOSIS — Z124 Encounter for screening for malignant neoplasm of cervix: Secondary | ICD-10-CM | POA: Diagnosis not present

## 2021-08-13 DIAGNOSIS — Z1151 Encounter for screening for human papillomavirus (HPV): Secondary | ICD-10-CM | POA: Diagnosis not present

## 2021-08-13 DIAGNOSIS — Z01419 Encounter for gynecological examination (general) (routine) without abnormal findings: Secondary | ICD-10-CM | POA: Diagnosis not present

## 2021-08-14 ENCOUNTER — Ambulatory Visit
Admission: RE | Admit: 2021-08-14 | Discharge: 2021-08-14 | Disposition: A | Discharge status: Home / Self Care | Payer: Medicare Other | Source: Ambulatory Visit | Attending: Obstetrics and Gynecology | Admitting: Obstetrics and Gynecology

## 2021-08-14 DIAGNOSIS — Z1231 Encounter for screening mammogram for malignant neoplasm of breast: Secondary | ICD-10-CM

## 2021-09-15 DIAGNOSIS — S92302B Fracture of unspecified metatarsal bone(s), left foot, initial encounter for open fracture: Secondary | ICD-10-CM | POA: Diagnosis not present

## 2021-09-19 DIAGNOSIS — M79672 Pain in left foot: Secondary | ICD-10-CM | POA: Diagnosis not present

## 2021-09-19 DIAGNOSIS — S92355A Nondisplaced fracture of fifth metatarsal bone, left foot, initial encounter for closed fracture: Secondary | ICD-10-CM | POA: Diagnosis not present

## 2021-09-25 DIAGNOSIS — Z1283 Encounter for screening for malignant neoplasm of skin: Secondary | ICD-10-CM | POA: Diagnosis not present

## 2021-09-25 DIAGNOSIS — Z85820 Personal history of malignant melanoma of skin: Secondary | ICD-10-CM | POA: Diagnosis not present

## 2021-09-25 DIAGNOSIS — D225 Melanocytic nevi of trunk: Secondary | ICD-10-CM | POA: Diagnosis not present

## 2021-09-25 DIAGNOSIS — Z08 Encounter for follow-up examination after completed treatment for malignant neoplasm: Secondary | ICD-10-CM | POA: Diagnosis not present

## 2021-10-13 ENCOUNTER — Encounter: Payer: Self-pay | Admitting: Diagnostic Neuroimaging

## 2021-10-15 ENCOUNTER — Other Ambulatory Visit: Payer: Self-pay | Admitting: *Deleted

## 2021-10-15 DIAGNOSIS — G250 Essential tremor: Secondary | ICD-10-CM

## 2021-10-15 MED ORDER — PRIMIDONE 50 MG PO TABS: 50.0000 mg | ORAL_TABLET | Freq: Two times a day (BID) | ORAL | 5 refills | Status: DC

## 2021-10-16 DIAGNOSIS — Z803 Family history of malignant neoplasm of breast: Secondary | ICD-10-CM | POA: Diagnosis not present

## 2021-10-19 DIAGNOSIS — S92355A Nondisplaced fracture of fifth metatarsal bone, left foot, initial encounter for closed fracture: Secondary | ICD-10-CM | POA: Diagnosis not present

## 2021-11-19 DIAGNOSIS — U071 COVID-19: Secondary | ICD-10-CM | POA: Diagnosis not present

## 2021-11-20 ENCOUNTER — Encounter: Payer: Self-pay | Admitting: Diagnostic Neuroimaging

## 2021-11-27 DIAGNOSIS — R0602 Shortness of breath: Secondary | ICD-10-CM | POA: Diagnosis not present

## 2021-11-27 DIAGNOSIS — R059 Cough, unspecified: Secondary | ICD-10-CM | POA: Diagnosis not present

## 2021-11-27 DIAGNOSIS — U071 COVID-19: Secondary | ICD-10-CM | POA: Diagnosis not present

## 2021-12-12 DIAGNOSIS — S92355A Nondisplaced fracture of fifth metatarsal bone, left foot, initial encounter for closed fracture: Secondary | ICD-10-CM | POA: Diagnosis not present

## 2021-12-25 DIAGNOSIS — Z23 Encounter for immunization: Secondary | ICD-10-CM | POA: Diagnosis not present

## 2022-01-01 DIAGNOSIS — Z08 Encounter for follow-up examination after completed treatment for malignant neoplasm: Secondary | ICD-10-CM | POA: Diagnosis not present

## 2022-01-01 DIAGNOSIS — Z1283 Encounter for screening for malignant neoplasm of skin: Secondary | ICD-10-CM | POA: Diagnosis not present

## 2022-01-01 DIAGNOSIS — D225 Melanocytic nevi of trunk: Secondary | ICD-10-CM | POA: Diagnosis not present

## 2022-01-01 DIAGNOSIS — Z85820 Personal history of malignant melanoma of skin: Secondary | ICD-10-CM | POA: Diagnosis not present

## 2022-01-25 DIAGNOSIS — H04123 Dry eye syndrome of bilateral lacrimal glands: Secondary | ICD-10-CM | POA: Diagnosis not present

## 2022-01-25 DIAGNOSIS — H2513 Age-related nuclear cataract, bilateral: Secondary | ICD-10-CM | POA: Diagnosis not present

## 2022-02-05 DIAGNOSIS — Z86010 Personal history of colonic polyps: Secondary | ICD-10-CM | POA: Diagnosis not present

## 2022-02-05 DIAGNOSIS — Z148 Genetic carrier of other disease: Secondary | ICD-10-CM | POA: Diagnosis not present

## 2022-02-12 DIAGNOSIS — N952 Postmenopausal atrophic vaginitis: Secondary | ICD-10-CM | POA: Diagnosis not present

## 2022-02-12 DIAGNOSIS — R3915 Urgency of urination: Secondary | ICD-10-CM | POA: Diagnosis not present

## 2022-03-09 DIAGNOSIS — J111 Influenza due to unidentified influenza virus with other respiratory manifestations: Secondary | ICD-10-CM | POA: Diagnosis not present

## 2022-03-09 DIAGNOSIS — Z20828 Contact with and (suspected) exposure to other viral communicable diseases: Secondary | ICD-10-CM | POA: Diagnosis not present

## 2022-03-18 IMAGING — MG MM DIGITAL SCREENING BILAT W/ TOMO AND CAD
6 of 10 series · 6 of 30 positions shown · non-contrast
Comparison: Previous exam(s).

CLINICAL DATA: Screening.

EXAM:
DIGITAL SCREENING BILATERAL MAMMOGRAM WITH TOMOSYNTHESIS AND CAD
TECHNIQUE: Bilateral screening digital craniocaudal and mediolateral oblique
mammograms were obtained. Bilateral screening digital breast
tomosynthesis was performed. The images were evaluated with
computer-aided detection.

[R MLO synth-2D (1 of 2)]
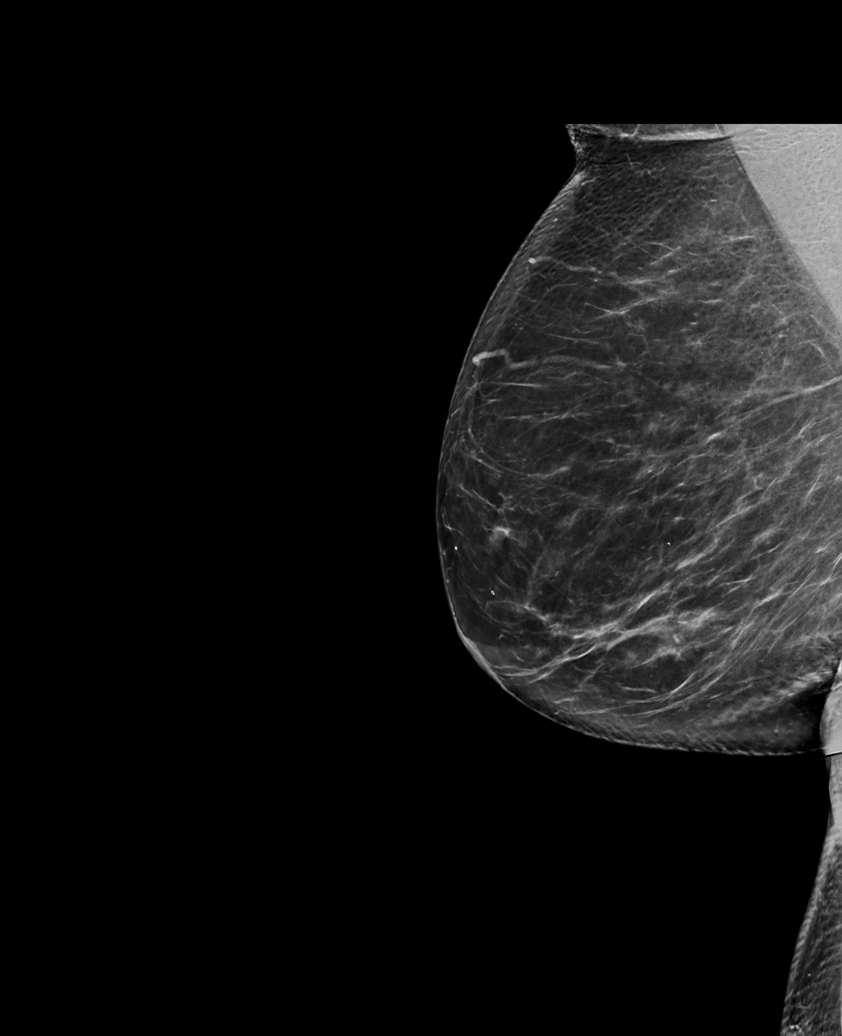

[L MLO synth-2D]
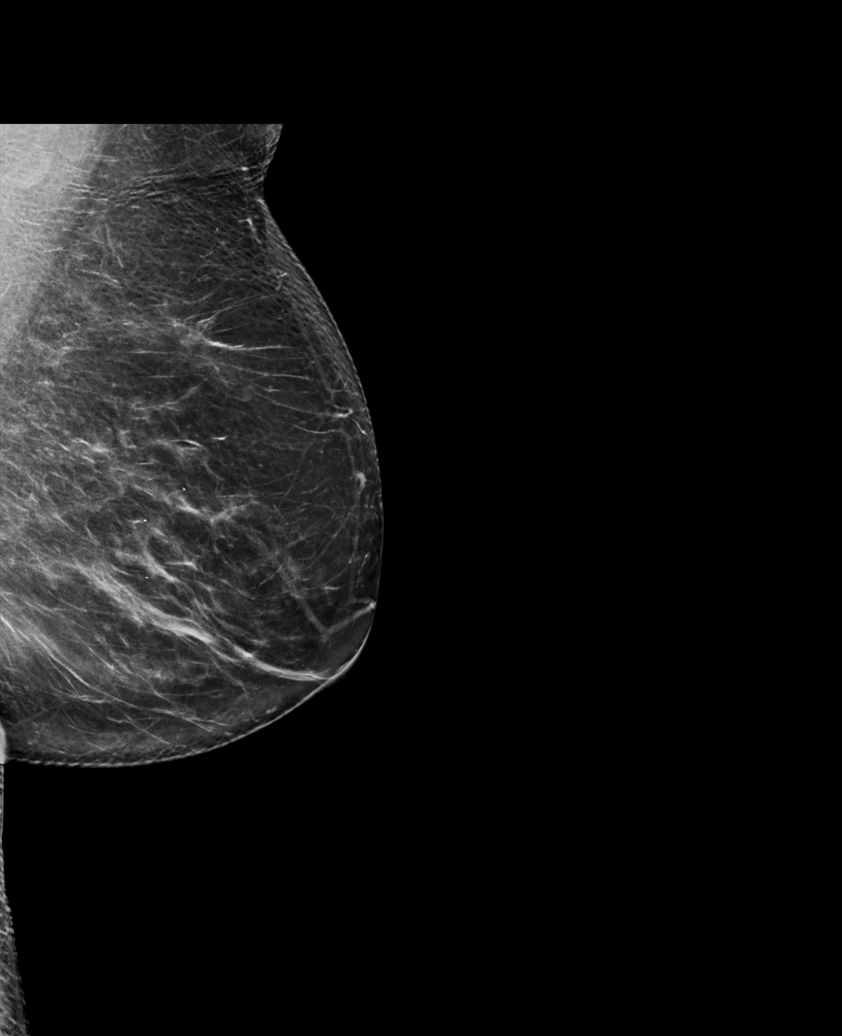

[L CC synth-2D]
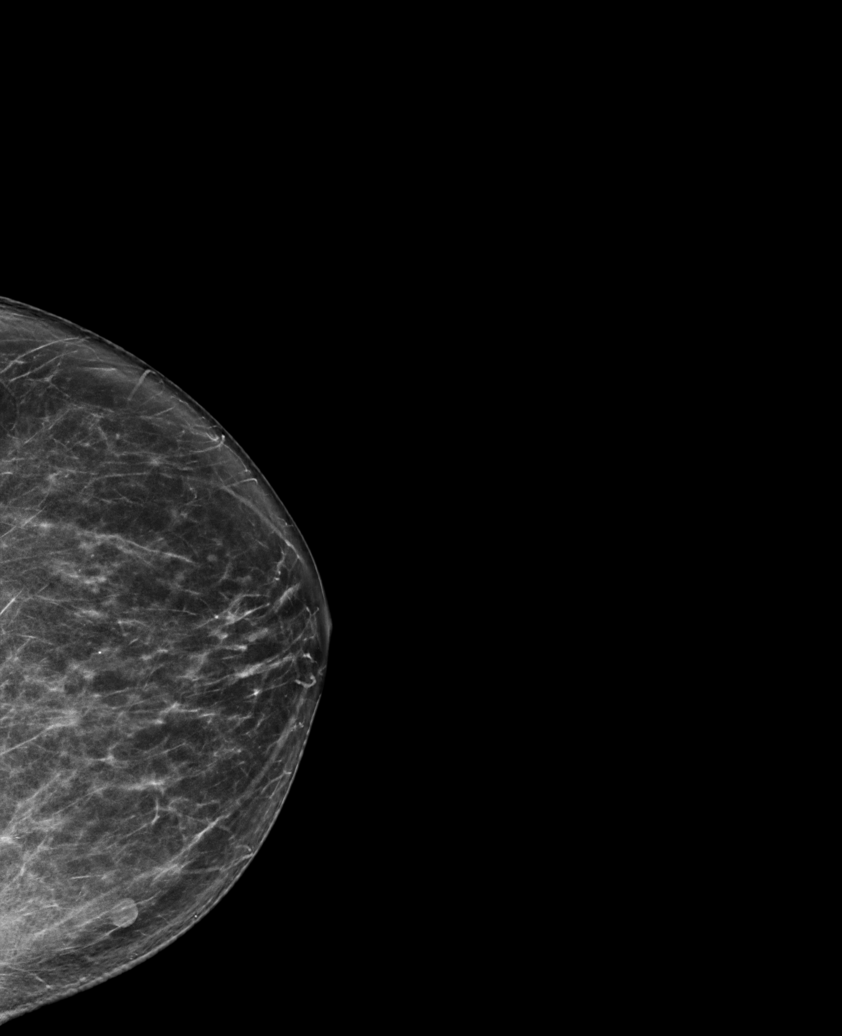

[R MLO synth-2D (2 of 2)]
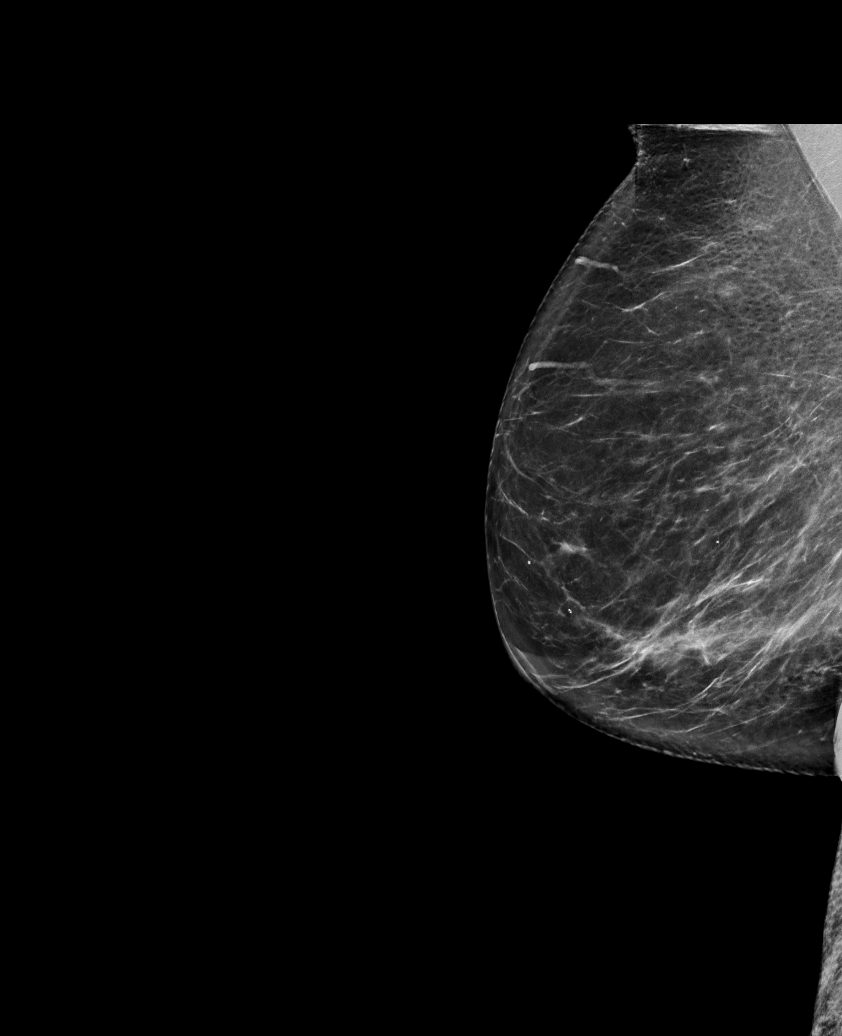

[R CC synth-2D]
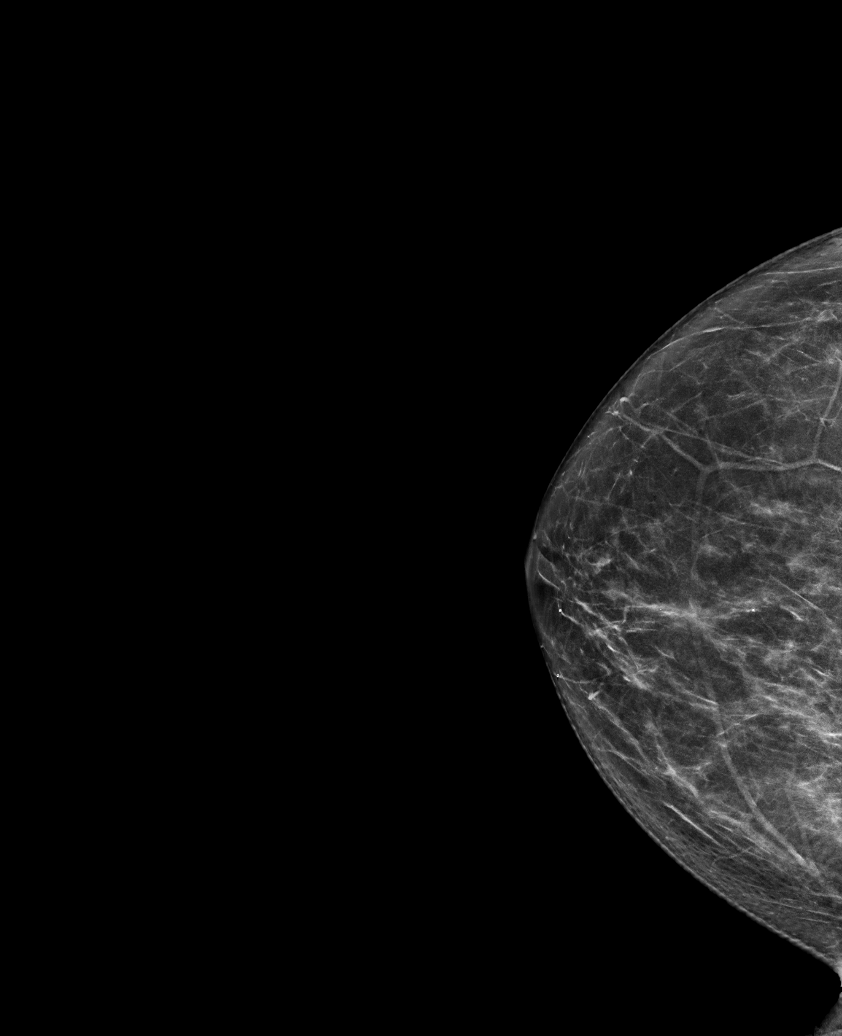

[L CC tomo · tomo slice 35/69.0]
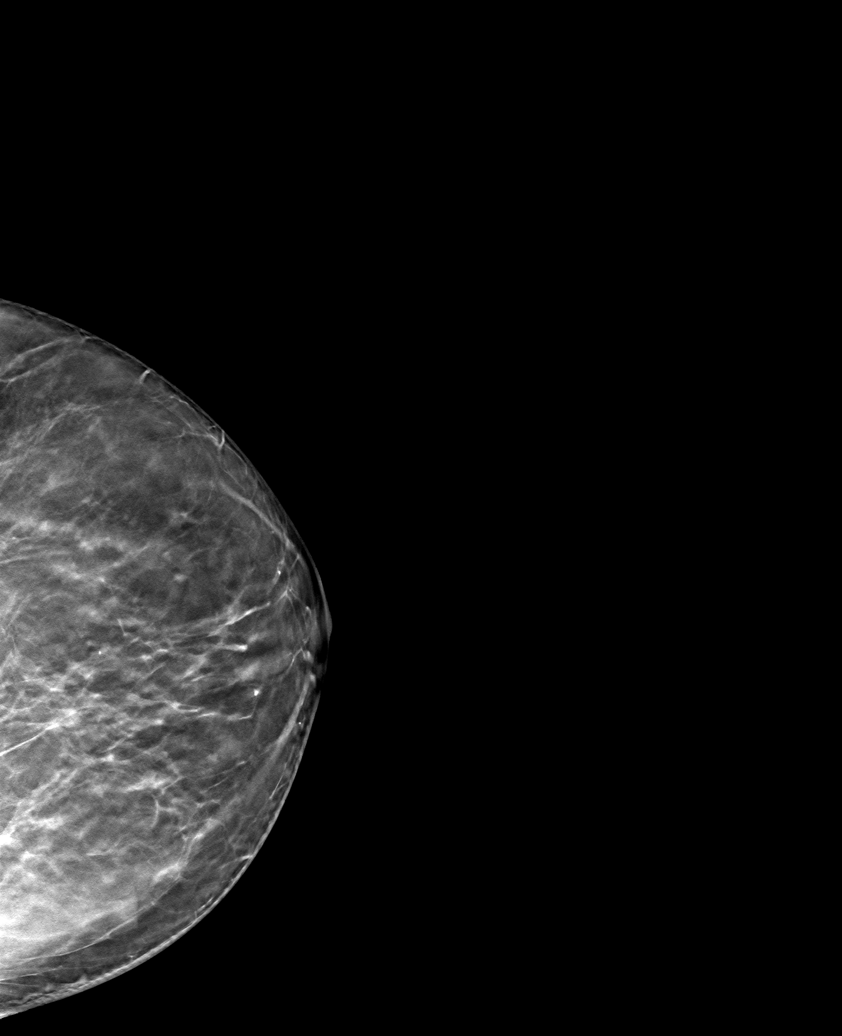

[6 of 30 positions shown; findings below may reference images not displayed]

ACR Breast Density Category b: There are scattered areas of
fibroglandular density.
FINDINGS: There are no findings suspicious for malignancy. The images were
evaluated with computer-aided detection.
IMPRESSION: No mammographic evidence of malignancy. A result letter of this
screening mammogram will be mailed directly to the patient.

RECOMMENDATION:
Screening mammogram in one year. (Code:[id number]-[id number])

BI-RADS CATEGORY  1: Negative.

## 2022-03-21 ENCOUNTER — Other Ambulatory Visit: Payer: Self-pay | Admitting: Diagnostic Neuroimaging

## 2022-03-21 DIAGNOSIS — G250 Essential tremor: Secondary | ICD-10-CM

## 2022-04-03 DIAGNOSIS — Z85820 Personal history of malignant melanoma of skin: Secondary | ICD-10-CM | POA: Diagnosis not present

## 2022-04-03 DIAGNOSIS — L821 Other seborrheic keratosis: Secondary | ICD-10-CM | POA: Diagnosis not present

## 2022-04-03 DIAGNOSIS — D225 Melanocytic nevi of trunk: Secondary | ICD-10-CM | POA: Diagnosis not present

## 2022-04-03 DIAGNOSIS — Z1283 Encounter for screening for malignant neoplasm of skin: Secondary | ICD-10-CM | POA: Diagnosis not present

## 2022-04-03 DIAGNOSIS — Z08 Encounter for follow-up examination after completed treatment for malignant neoplasm: Secondary | ICD-10-CM | POA: Diagnosis not present

## 2022-04-11 DIAGNOSIS — Z23 Encounter for immunization: Secondary | ICD-10-CM | POA: Diagnosis not present

## 2022-04-16 DIAGNOSIS — H25013 Cortical age-related cataract, bilateral: Secondary | ICD-10-CM | POA: Diagnosis not present

## 2022-04-16 DIAGNOSIS — H18413 Arcus senilis, bilateral: Secondary | ICD-10-CM | POA: Diagnosis not present

## 2022-04-16 DIAGNOSIS — H25043 Posterior subcapsular polar age-related cataract, bilateral: Secondary | ICD-10-CM | POA: Diagnosis not present

## 2022-04-16 DIAGNOSIS — H2513 Age-related nuclear cataract, bilateral: Secondary | ICD-10-CM | POA: Diagnosis not present

## 2022-04-16 DIAGNOSIS — H2511 Age-related nuclear cataract, right eye: Secondary | ICD-10-CM | POA: Diagnosis not present

## 2022-06-10 DIAGNOSIS — J0100 Acute maxillary sinusitis, unspecified: Secondary | ICD-10-CM | POA: Diagnosis not present

## 2022-06-10 DIAGNOSIS — Z6828 Body mass index (BMI) 28.0-28.9, adult: Secondary | ICD-10-CM | POA: Diagnosis not present

## 2022-06-16 ENCOUNTER — Other Ambulatory Visit: Payer: Self-pay | Admitting: Diagnostic Neuroimaging

## 2022-06-16 DIAGNOSIS — G250 Essential tremor: Secondary | ICD-10-CM

## 2022-07-08 DIAGNOSIS — H2512 Age-related nuclear cataract, left eye: Secondary | ICD-10-CM | POA: Diagnosis not present

## 2022-07-09 DIAGNOSIS — H2511 Age-related nuclear cataract, right eye: Secondary | ICD-10-CM | POA: Diagnosis not present

## 2022-07-17 DIAGNOSIS — Z08 Encounter for follow-up examination after completed treatment for malignant neoplasm: Secondary | ICD-10-CM | POA: Diagnosis not present

## 2022-07-17 DIAGNOSIS — Z1283 Encounter for screening for malignant neoplasm of skin: Secondary | ICD-10-CM | POA: Diagnosis not present

## 2022-07-17 DIAGNOSIS — D225 Melanocytic nevi of trunk: Secondary | ICD-10-CM | POA: Diagnosis not present

## 2022-07-17 DIAGNOSIS — Z85820 Personal history of malignant melanoma of skin: Secondary | ICD-10-CM | POA: Diagnosis not present

## 2022-07-22 DIAGNOSIS — H2511 Age-related nuclear cataract, right eye: Secondary | ICD-10-CM | POA: Diagnosis not present

## 2022-07-29 ENCOUNTER — Encounter: Payer: Self-pay | Admitting: Diagnostic Neuroimaging

## 2022-07-29 ENCOUNTER — Telehealth (INDEPENDENT_AMBULATORY_CARE_PROVIDER_SITE_OTHER): Payer: Medicare Other | Admitting: Diagnostic Neuroimaging

## 2022-07-29 DIAGNOSIS — G250 Essential tremor: Secondary | ICD-10-CM | POA: Diagnosis not present

## 2022-07-29 MED ORDER — PRIMIDONE 50 MG PO TABS: 50.0000 mg | ORAL_TABLET | Freq: Two times a day (BID) | ORAL | 5 refills | Status: DC

## 2022-07-29 NOTE — Patient Instructions (Signed)
Essential tremor - continue propranolol ER 80mg  daily - increase primidone to 50 / 100, then 100mg  twice a day  - consider topiramate or gabapentin in future - consider deep brain stimulator in future

## 2022-07-29 NOTE — Progress Notes (Signed)
[facility]  PATIENT: Sharon Miller DOB: [birth year]  REFERRING CLINICIAN: Rankins, Fanny Dance, MD HISTORY FROM: patient  REASON FOR VISIT: follow up   HISTORICAL  CHIEF COMPLAINT:  Chief Complaint  Patient presents with   Tremors    HISTORY OF PRESENT ILLNESS:   UPDATE (07/29/22, VRP): Since last visit, doing about the same with mainly left hand tremor. On primidone 50mg  twice a day. Right hand ok.   UPDATE (07/23/21, VRP): Since last visit, tried primidone for 1-2 months, no benefit, so weaned off. Since then tremor is worse. Wants to try again.   PRIOR HPI (06/14/20): 72 year old female here for evaluation of essential tremor.  Patient has had mild postural action tremor in upper extremities since high school.  She has strong family history of similar tremor in both of her parents and 5 siblings.  She has been on propranolol 80 mg/day for many years.  Symptoms have been progressing over time.  Her left hand is more affected than right.  She would like to try some other medications to help with tremor control.  She denies any resting tremor.  No gait or balance difficulty.  No numbness or tingling.  No headaches or vision changes.  No speech or swallowing difficulties.   REVIEW OF SYSTEMS: Full 14 system review of systems performed and negative with exception of: per HPI.  ALLERGIES: Allergies  Allergen Reactions   Codeine Other (See Comments)    migraine    HOME MEDICATIONS: Outpatient Medications Prior to Visit  Medication Sig Dispense Refill   atorvastatin (LIPITOR) 10 MG tablet atorvastatin 10 mg tablet  TAKE 1 TABLET BY MOUTH EVERY EVENING     Cholecalciferol (D3 PO) Take 25 mcg by mouth daily.     meloxicam (MOBIC) 15 MG tablet 15 mg as needed.     propranolol ER (INDERAL LA) 80 MG 24 hr capsule Take 1 capsule by mouth daily.     SUMAtriptan (IMITREX) 100 MG tablet      primidone (MYSOLINE) 50 MG tablet TAKE 1 TABLET BY MOUTH EVERYDAY AT  BEDTIME (Patient taking differently: Take 50 mg by mouth 2 (two) times daily.) 90 tablet 0   No facility-administered medications prior to visit.    PAST MEDICAL HISTORY: Past Medical History:  Diagnosis Date   Arthritis    Migraine    Mixed dyslipidemia    Plantar fasciitis    Tremor     PAST SURGICAL HISTORY: Past Surgical History:  Procedure Laterality Date   BREAST BIOPSY Left    DENTAL SURGERY  08/1968   wisdom teeth   LAPAROSCOPY  1983   D&C    FAMILY HISTORY: Family History  Problem Relation Age of Onset   Other Mother        MVA   Psoriasis Mother    Tremor Mother    Other Father        MVA   Tremor Father    Migraines Father    Tremor Sister    Migraines Sister    Cancer Sister        skin   Colitis Brother    Cancer Brother        skin   Tremor Brother    Tremor Brother    Tremor Sister    Cancer Maternal Grandmother    Heart attack Maternal Grandfather    Cancer Paternal Grandmother    Tremor Brother     SOCIAL HISTORY: Social History   Socioeconomic History  Marital status: Married    Spouse name: Dorene Sorrow   Number of children: 3   Years of education: 15   Highest education level: Not on file  Occupational History    CommentMarine scientist, retired  Tobacco Use   Smoking status: Never   Smokeless tobacco: Never  Substance and Sexual Activity   Alcohol use: Yes    Alcohol/week: 5.0 - 7.0 standard drinks of alcohol    Types: 5 - 7 Glasses of wine per week    Comment: wine 1-2 a few times a week   Drug use: Never   Sexual activity: Not on file  Other Topics Concern   Not on file  Social History Narrative   Lives with husband   caffeine- 1 c coffee daily   Social Determinants of Health   Financial Resource Strain: Not on file  Food Insecurity: Not on file  Transportation Needs: Not on file  Physical Activity: Not on file  Stress: Not on file  Social Connections: Not on file  Intimate Partner Violence: Not on file      PHYSICAL EXAM  GENERAL EXAM/CONSTITUTIONAL: Vitals:  There were no vitals filed for this visit.  There is no height or weight on file to calculate BMI. Wt Readings from Last 3 Encounters:  07/23/21 170 lb (77.1 kg)  06/14/20 170 lb 9.6 oz (77.4 kg)   Patient is in no distress; well developed, nourished and groomed; neck is supple  CARDIOVASCULAR: Examination of carotid arteries is normal; no carotid bruits Regular rate and rhythm, no murmurs Examination of peripheral vascular system by observation and palpation is normal  EYES: Ophthalmoscopic exam of optic discs and posterior segments is normal; no papilledema or hemorrhages No results found.  MUSCULOSKELETAL: Gait, strength, tone, movements noted in Neurologic exam below  NEUROLOGIC: MENTAL STATUS:      No data to display         awake, alert, oriented to person, place and time recent and remote memory intact normal attention and concentration language fluent, comprehension intact, naming intact fund of knowledge appropriate  CRANIAL NERVE:  2nd - no papilledema on fundoscopic exam 2nd, 3rd, 4th, 6th - pupils equal and reactive to light, visual fields full to confrontation, extraocular muscles intact, no nystagmus 5th - facial sensation symmetric 7th - facial strength symmetric 8th - hearing intact 9th - palate elevates symmetrically, uvula midline 11th - shoulder shrug symmetric 12th - tongue protrusion midline  MOTOR:  POSTURAL AND ACTION TREMOR IN BUE (L > R) normal bulk and tone, full strength in the BUE, BLE  SENSORY:  normal and symmetric to light touch, temperature, vibration  COORDINATION:  finger-nose-finger, fine finger movements normal  REFLEXES:  deep tendon reflexes present and symmetric  GAIT/STATION:  narrow based gait     DIAGNOSTIC DATA (LABS, IMAGING, TESTING) - I reviewed patient records, labs, notes, testing and imaging myself where available.  Lab Results   Component Value Date   HGB 13.6 08/19/2009   HCT 40.0 08/19/2009      Component Value Date/Time   NA 142 08/19/2009 0654   K 5.0 08/19/2009 0654   CL 107 08/19/2009 0654   GLUCOSE 97 08/19/2009 0654   BUN 16 08/19/2009 0654   CREATININE 0.8 08/19/2009 0654   No results found for: "CHOL", "HDL", "LDLCALC", "LDLDIRECT", "TRIG", "CHOLHDL" No results found for: "HGBA1C" No results found for: "VITAMINB12" No results found for: "TSH"    ASSESSMENT AND PLAN  72 y.o. 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  Dx:  1. Essential tremor      PLAN:  Essential tremor - continue propranolol ER 80mg  daily - increase primidone to 50 / 100, then 100mg  twice a day  - consider topiramate or gabapentin in future - consider deep brain stimulator in future  Meds ordered this encounter  Medications   primidone (MYSOLINE) 50 MG tablet    Sig: Take 1-2 tablets (50-100 mg total) by mouth 2 (two) times daily.    Dispense:  120 tablet    Refill:  5   Return in about 1 year (around 07/29/2023).  Virtual Visit via Video Note  I connected with Sharon Miller on 07/29/22 at  1:30 PM EDT by a video enabled telemedicine application and verified that I am speaking with the correct person using two identifiers.   I discussed the limitations of evaluation and management by telemedicine and the availability of in person appointments. The patient expressed understanding and agreed to proceed.  Patient is at home and I am at the office.   I spent 15 minutes of face-to-face and non-face-to-face time with patient.  This included previsit chart review, lab review, study review, order entry, electronic health record documentation, patient education.      Suanne Marker, MD 07/29/2022, 1:18 PM Certified in Neurology, Neurophysiology and Neuroimaging  [facility] [address], [address] [place], [place] [address] [phone]

## 2022-08-13 DIAGNOSIS — Z0000 Encounter for general adult medical examination without abnormal findings: Secondary | ICD-10-CM | POA: Diagnosis not present

## 2022-08-13 DIAGNOSIS — E782 Mixed hyperlipidemia: Secondary | ICD-10-CM | POA: Diagnosis not present

## 2022-08-13 DIAGNOSIS — Z8669 Personal history of other diseases of the nervous system and sense organs: Secondary | ICD-10-CM | POA: Diagnosis not present

## 2022-08-13 DIAGNOSIS — G250 Essential tremor: Secondary | ICD-10-CM | POA: Diagnosis not present

## 2022-08-13 DIAGNOSIS — Z131 Encounter for screening for diabetes mellitus: Secondary | ICD-10-CM | POA: Diagnosis not present

## 2022-08-13 DIAGNOSIS — Z86010 Personal history of colonic polyps: Secondary | ICD-10-CM | POA: Diagnosis not present

## 2022-08-13 DIAGNOSIS — G43909 Migraine, unspecified, not intractable, without status migrainosus: Secondary | ICD-10-CM | POA: Diagnosis not present

## 2022-08-13 DIAGNOSIS — E663 Overweight: Secondary | ICD-10-CM | POA: Diagnosis not present

## 2022-08-13 DIAGNOSIS — M1990 Unspecified osteoarthritis, unspecified site: Secondary | ICD-10-CM | POA: Diagnosis not present

## 2022-08-14 ENCOUNTER — Other Ambulatory Visit: Payer: Self-pay | Admitting: Obstetrics and Gynecology

## 2022-08-14 DIAGNOSIS — Z1231 Encounter for screening mammogram for malignant neoplasm of breast: Secondary | ICD-10-CM

## 2022-08-19 DIAGNOSIS — N390 Urinary tract infection, site not specified: Secondary | ICD-10-CM | POA: Diagnosis not present

## 2022-08-19 DIAGNOSIS — Z6828 Body mass index (BMI) 28.0-28.9, adult: Secondary | ICD-10-CM | POA: Diagnosis not present

## 2022-08-19 DIAGNOSIS — R82998 Other abnormal findings in urine: Secondary | ICD-10-CM | POA: Diagnosis not present

## 2022-08-19 DIAGNOSIS — Z124 Encounter for screening for malignant neoplasm of cervix: Secondary | ICD-10-CM | POA: Diagnosis not present

## 2022-08-27 ENCOUNTER — Ambulatory Visit
Admission: RE | Admit: 2022-08-27 | Discharge: 2022-08-27 | Disposition: A | Discharge status: Home / Self Care | Payer: Medicare Other | Source: Ambulatory Visit | Attending: Obstetrics and Gynecology | Admitting: Obstetrics and Gynecology

## 2022-08-27 DIAGNOSIS — Z1231 Encounter for screening mammogram for malignant neoplasm of breast: Secondary | ICD-10-CM

## 2022-09-25 DIAGNOSIS — N390 Urinary tract infection, site not specified: Secondary | ICD-10-CM | POA: Diagnosis not present

## 2022-10-22 DIAGNOSIS — X32XXXD Exposure to sunlight, subsequent encounter: Secondary | ICD-10-CM | POA: Diagnosis not present

## 2022-10-22 DIAGNOSIS — Z08 Encounter for follow-up examination after completed treatment for malignant neoplasm: Secondary | ICD-10-CM | POA: Diagnosis not present

## 2022-10-22 DIAGNOSIS — L570 Actinic keratosis: Secondary | ICD-10-CM | POA: Diagnosis not present

## 2022-10-22 DIAGNOSIS — B078 Other viral warts: Secondary | ICD-10-CM | POA: Diagnosis not present

## 2022-10-22 DIAGNOSIS — Z1283 Encounter for screening for malignant neoplasm of skin: Secondary | ICD-10-CM | POA: Diagnosis not present

## 2022-10-22 DIAGNOSIS — Z85820 Personal history of malignant melanoma of skin: Secondary | ICD-10-CM | POA: Diagnosis not present

## 2022-11-23 DIAGNOSIS — Z23 Encounter for immunization: Secondary | ICD-10-CM | POA: Diagnosis not present

## 2022-11-28 ENCOUNTER — Other Ambulatory Visit: Payer: Self-pay | Admitting: Diagnostic Neuroimaging

## 2022-11-28 DIAGNOSIS — G250 Essential tremor: Secondary | ICD-10-CM

## 2022-12-24 DIAGNOSIS — Z23 Encounter for immunization: Secondary | ICD-10-CM | POA: Diagnosis not present

## 2023-01-06 DIAGNOSIS — N390 Urinary tract infection, site not specified: Secondary | ICD-10-CM | POA: Diagnosis not present

## 2023-01-06 DIAGNOSIS — Z6829 Body mass index (BMI) 29.0-29.9, adult: Secondary | ICD-10-CM | POA: Diagnosis not present

## 2023-01-06 DIAGNOSIS — R350 Frequency of micturition: Secondary | ICD-10-CM | POA: Diagnosis not present

## 2023-01-06 DIAGNOSIS — G250 Essential tremor: Secondary | ICD-10-CM | POA: Diagnosis not present

## 2023-01-06 DIAGNOSIS — B3731 Acute candidiasis of vulva and vagina: Secondary | ICD-10-CM | POA: Diagnosis not present

## 2023-02-11 DIAGNOSIS — R8271 Bacteriuria: Secondary | ICD-10-CM | POA: Diagnosis not present

## 2023-02-11 DIAGNOSIS — N952 Postmenopausal atrophic vaginitis: Secondary | ICD-10-CM | POA: Diagnosis not present

## 2023-02-12 DIAGNOSIS — D225 Melanocytic nevi of trunk: Secondary | ICD-10-CM | POA: Diagnosis not present

## 2023-02-12 DIAGNOSIS — Z85820 Personal history of malignant melanoma of skin: Secondary | ICD-10-CM | POA: Diagnosis not present

## 2023-02-12 DIAGNOSIS — L820 Inflamed seborrheic keratosis: Secondary | ICD-10-CM | POA: Diagnosis not present

## 2023-02-12 DIAGNOSIS — L218 Other seborrheic dermatitis: Secondary | ICD-10-CM | POA: Diagnosis not present

## 2023-02-12 DIAGNOSIS — Z1283 Encounter for screening for malignant neoplasm of skin: Secondary | ICD-10-CM | POA: Diagnosis not present

## 2023-02-12 DIAGNOSIS — Z08 Encounter for follow-up examination after completed treatment for malignant neoplasm: Secondary | ICD-10-CM | POA: Diagnosis not present

## 2023-02-18 DIAGNOSIS — Z23 Encounter for immunization: Secondary | ICD-10-CM | POA: Diagnosis not present

## 2023-05-07 DIAGNOSIS — Z85820 Personal history of malignant melanoma of skin: Secondary | ICD-10-CM | POA: Diagnosis not present

## 2023-05-07 DIAGNOSIS — Z08 Encounter for follow-up examination after completed treatment for malignant neoplasm: Secondary | ICD-10-CM | POA: Diagnosis not present

## 2023-05-07 DIAGNOSIS — Z1283 Encounter for screening for malignant neoplasm of skin: Secondary | ICD-10-CM | POA: Diagnosis not present

## 2023-05-28 ENCOUNTER — Other Ambulatory Visit: Payer: Self-pay | Admitting: Diagnostic Neuroimaging

## 2023-05-28 DIAGNOSIS — G250 Essential tremor: Secondary | ICD-10-CM

## 2023-05-28 NOTE — Telephone Encounter (Signed)
 Last seen on 07/29/22 per note "Return in about 1 year (around 07/29/2023). " Follow up scheduled

## 2023-07-14 ENCOUNTER — Other Ambulatory Visit: Payer: Self-pay | Admitting: Obstetrics and Gynecology

## 2023-07-14 DIAGNOSIS — Z1231 Encounter for screening mammogram for malignant neoplasm of breast: Secondary | ICD-10-CM

## 2023-08-18 DIAGNOSIS — E663 Overweight: Secondary | ICD-10-CM | POA: Diagnosis not present

## 2023-08-18 DIAGNOSIS — M1990 Unspecified osteoarthritis, unspecified site: Secondary | ICD-10-CM | POA: Diagnosis not present

## 2023-08-18 DIAGNOSIS — E559 Vitamin D deficiency, unspecified: Secondary | ICD-10-CM | POA: Diagnosis not present

## 2023-08-18 DIAGNOSIS — R399 Unspecified symptoms and signs involving the genitourinary system: Secondary | ICD-10-CM | POA: Diagnosis not present

## 2023-08-18 DIAGNOSIS — Z131 Encounter for screening for diabetes mellitus: Secondary | ICD-10-CM | POA: Diagnosis not present

## 2023-08-18 DIAGNOSIS — G250 Essential tremor: Secondary | ICD-10-CM | POA: Diagnosis not present

## 2023-08-18 DIAGNOSIS — Z6827 Body mass index (BMI) 27.0-27.9, adult: Secondary | ICD-10-CM | POA: Diagnosis not present

## 2023-08-18 DIAGNOSIS — Z0000 Encounter for general adult medical examination without abnormal findings: Secondary | ICD-10-CM | POA: Diagnosis not present

## 2023-08-18 DIAGNOSIS — E782 Mixed hyperlipidemia: Secondary | ICD-10-CM | POA: Diagnosis not present

## 2023-08-18 DIAGNOSIS — R7303 Prediabetes: Secondary | ICD-10-CM | POA: Diagnosis not present

## 2023-08-28 ENCOUNTER — Other Ambulatory Visit: Payer: Self-pay | Admitting: Diagnostic Neuroimaging

## 2023-08-28 DIAGNOSIS — G250 Essential tremor: Secondary | ICD-10-CM

## 2023-09-03 ENCOUNTER — Telehealth: Payer: Self-pay

## 2023-09-03 NOTE — Telephone Encounter (Signed)
 Mychart message sent about needed appointment

## 2023-09-04 ENCOUNTER — Ambulatory Visit
Admission: RE | Admit: 2023-09-04 | Discharge: 2023-09-04 | Disposition: A | Discharge status: Home / Self Care | Source: Ambulatory Visit | Attending: Obstetrics and Gynecology | Admitting: Obstetrics and Gynecology

## 2023-09-04 DIAGNOSIS — Z1231 Encounter for screening mammogram for malignant neoplasm of breast: Secondary | ICD-10-CM

## 2023-09-04 HISTORY — DX: Encounter for nonprocreative screening for genetic disease carrier status: Z13.71

## 2023-09-09 ENCOUNTER — Other Ambulatory Visit: Payer: Self-pay | Admitting: Obstetrics and Gynecology

## 2023-09-09 DIAGNOSIS — R928 Other abnormal and inconclusive findings on diagnostic imaging of breast: Secondary | ICD-10-CM

## 2023-09-10 DIAGNOSIS — Z124 Encounter for screening for malignant neoplasm of cervix: Secondary | ICD-10-CM | POA: Diagnosis not present

## 2023-09-10 DIAGNOSIS — Z6826 Body mass index (BMI) 26.0-26.9, adult: Secondary | ICD-10-CM | POA: Diagnosis not present

## 2023-09-16 DIAGNOSIS — B3731 Acute candidiasis of vulva and vagina: Secondary | ICD-10-CM | POA: Diagnosis not present

## 2023-09-16 DIAGNOSIS — N390 Urinary tract infection, site not specified: Secondary | ICD-10-CM | POA: Diagnosis not present

## 2023-09-17 ENCOUNTER — Ambulatory Visit
Admission: RE | Admit: 2023-09-17 | Discharge: 2023-09-17 | Disposition: A | Discharge status: Home / Self Care | Source: Ambulatory Visit | Attending: Obstetrics and Gynecology | Admitting: Obstetrics and Gynecology

## 2023-09-17 ENCOUNTER — Ambulatory Visit

## 2023-09-17 DIAGNOSIS — R928 Other abnormal and inconclusive findings on diagnostic imaging of breast: Secondary | ICD-10-CM

## 2023-10-15 DIAGNOSIS — H00022 Hordeolum internum right lower eyelid: Secondary | ICD-10-CM | POA: Diagnosis not present

## 2023-10-15 DIAGNOSIS — H04123 Dry eye syndrome of bilateral lacrimal glands: Secondary | ICD-10-CM | POA: Diagnosis not present

## 2023-11-05 DIAGNOSIS — Z1283 Encounter for screening for malignant neoplasm of skin: Secondary | ICD-10-CM | POA: Diagnosis not present

## 2023-11-05 DIAGNOSIS — Z08 Encounter for follow-up examination after completed treatment for malignant neoplasm: Secondary | ICD-10-CM | POA: Diagnosis not present

## 2023-11-05 DIAGNOSIS — Z85820 Personal history of malignant melanoma of skin: Secondary | ICD-10-CM | POA: Diagnosis not present

## 2023-11-05 DIAGNOSIS — D225 Melanocytic nevi of trunk: Secondary | ICD-10-CM | POA: Diagnosis not present

## 2023-11-18 DIAGNOSIS — H02882 Meibomian gland dysfunction right lower eyelid: Secondary | ICD-10-CM | POA: Diagnosis not present

## 2023-11-18 DIAGNOSIS — H02885 Meibomian gland dysfunction left lower eyelid: Secondary | ICD-10-CM | POA: Diagnosis not present

## 2023-12-08 DIAGNOSIS — R399 Unspecified symptoms and signs involving the genitourinary system: Secondary | ICD-10-CM | POA: Diagnosis not present

## 2023-12-08 DIAGNOSIS — N390 Urinary tract infection, site not specified: Secondary | ICD-10-CM | POA: Diagnosis not present

## 2023-12-10 ENCOUNTER — Ambulatory Visit (INDEPENDENT_AMBULATORY_CARE_PROVIDER_SITE_OTHER): Admitting: Family Medicine

## 2023-12-10 ENCOUNTER — Encounter: Payer: Self-pay | Admitting: Family Medicine

## 2023-12-10 VITALS — BP 115/80 | HR 59 | Ht 65.0 in | Wt 159.0 lb

## 2023-12-10 DIAGNOSIS — G250 Essential tremor: Secondary | ICD-10-CM | POA: Diagnosis not present

## 2023-12-10 MED ORDER — PRIMIDONE 50 MG PO TABS: ORAL_TABLET | ORAL | 3 refills | Status: AC

## 2023-12-10 NOTE — Patient Instructions (Signed)
 Below is our plan:  We will continue primidone  50mg  in the morning and 100mg  in the evenings. Continue propanolol per PCP direction. We will check labs, today.   Please make sure you are staying well hydrated. I recommend 50-60 ounces daily. Well balanced diet and regular exercise encouraged. Consistent sleep schedule with 6-8 hours recommended.   Please continue follow up with care team as directed.   Follow up with me in 1 year   You may receive a survey regarding today's visit. I encourage you to leave honest feed back as I do use this information to improve patient care. Thank you for seeing me today!

## 2023-12-10 NOTE — Progress Notes (Signed)
 Chief Complaint  Patient presents with   Follow-up    Pt in room 2. Alone. Here for tremor follow up.    HISTORY OF PRESENT ILLNESS:  12/10/23 ALL:  Sharon Miller is a 73 y.o. female here today for follow up for ET. She was last seen by Dr Margaret primidone  to 50mg  in am and 100mg  at bedtime. Propranolol LA 120mg  added by PCP about a year ago. She reports tremor is stable. L hand worse than right. She is not overly bothered by tremor. She is tolerating meds without obvious adverse effects. BP stable. Pulse typically low to mid 60s. She exercises regularly.   HISTORY (copied from Dr Chancy previous note)  UPDATE (07/29/22, VRP): Since last visit, doing about the same with mainly left hand tremor. On primidone  50mg  twice a day. Right hand ok.    UPDATE (07/23/21, VRP): Since last visit, tried primidone  for 1-2 months, no benefit, so weaned off. Since then tremor is worse. Wants to try again.    PRIOR HPI (06/14/20): 73 year old female here for evaluation of essential tremor.  Patient has had mild postural action tremor in upper extremities since high school.  She has strong family history of similar tremor in both of her parents and 5 siblings.  She has been on propranolol 80 mg/day for many years.  Symptoms have been progressing over time.  Her left hand is more affected than right.  She would like to try some other medications to help with tremor control.   She denies any resting tremor.  No gait or balance difficulty.  No numbness or tingling.  No headaches or vision changes.  No speech or swallowing difficulties.   REVIEW OF SYSTEMS: Out of a complete 14 system review of symptoms, the patient complains only of the following symptoms, tremor and all other reviewed systems are negative.   ALLERGIES: Allergies  Allergen Reactions   Codeine Other (See Comments)    migraine     HOME MEDICATIONS: Outpatient Medications Prior to Visit  Medication Sig Dispense Refill    atorvastatin (LIPITOR) 10 MG tablet atorvastatin 10 mg tablet  TAKE 1 TABLET BY MOUTH EVERY EVENING     Cholecalciferol (D3 PO) Take 25 mcg by mouth daily.     PROPRANOLOL HCL ER PO Take 120 mg by mouth daily.     SUMAtriptan (IMITREX) 100 MG tablet      primidone  (MYSOLINE ) 50 MG tablet TAKE 1-2 TABLETS BY MOUTH 2 TIMES DAILY. 360 tablet 0   meloxicam (MOBIC) 15 MG tablet 15 mg as needed.     propranolol ER (INDERAL LA) 80 MG 24 hr capsule Take 1 capsule by mouth daily.     No facility-administered medications prior to visit.     PAST MEDICAL HISTORY: Past Medical History:  Diagnosis Date   Arthritis    BRCA gene mutation negative    Migraine    Mixed dyslipidemia    Plantar fasciitis    Tremor      PAST SURGICAL HISTORY: Past Surgical History:  Procedure Laterality Date   BREAST BIOPSY Left    CATARACT EXTRACTION     2024   DENTAL SURGERY  08/1968   wisdom teeth   LAPAROSCOPY  1983   D&C     FAMILY HISTORY: Family History  Problem Relation Age of Onset   Other Mother        MVA   Psoriasis Mother    Tremor Mother    Other Father  MVA   Tremor Father    Migraines Father    Tremor Sister    Migraines Sister    Cancer Sister        skin   Tremor Sister    Breast cancer Maternal Aunt 79 - 69   Breast cancer Paternal Aunt 68 - 69   Cancer Maternal Grandmother    Heart attack Maternal Grandfather    Cancer Paternal Grandmother    Colitis Brother    Cancer Brother        skin   Tremor Brother    Tremor Brother    Tremor Brother    BRCA 1/2 Neg Hx      SOCIAL HISTORY: Social History   Socioeconomic History   Marital status: Married    Spouse name: Christopher   Number of children: 3   Years of education: 15   Highest education level: Not on file  Occupational History    CommentMarine scientist, retired  Tobacco Use   Smoking status: Never   Smokeless tobacco: Never  Vaping Use   Vaping status: Never Used  Substance and Sexual  Activity   Alcohol use: Yes    Alcohol/week: 5.0 - 7.0 standard drinks of alcohol    Types: 5 - 7 Glasses of wine per week    Comment: wine 1-2 a few times a week   Drug use: Never   Sexual activity: Not on file  Other Topics Concern   Not on file  Social History Narrative   Lives with husband   caffeine- 1 c coffee daily   Social Drivers of Corporate investment banker Strain: Not on file  Food Insecurity: Not on file  Transportation Needs: Not on file  Physical Activity: Not on file  Stress: Not on file  Social Connections: Not on file  Intimate Partner Violence: Not on file     PHYSICAL EXAM  Vitals:   12/10/23 1022  BP: 115/80  Pulse: (!) 59  SpO2: 98%  Weight: 159 lb (72.1 kg)  Height: 5' 5 (1.651 m)   Body mass index is 26.46 kg/m.  Generalized: Well developed, in no acute distress  Cardiology: normal rate and rhythm, no murmur auscultated  Respiratory: clear to auscultation bilaterally    Neurological examination  Mentation: Alert oriented to time, place, history taking. Follows all commands speech and language fluent Cranial nerve II-XII: Pupils were equal round reactive to light. Extraocular movements were full, visual field were full on confrontational test. Facial sensation and strength were normal. Uvula tongue midline. Head turning and shoulder shrug  were normal and symmetric. Motor: The motor testing reveals 5 over 5 strength of all 4 extremities. Good symmetric motor tone is noted throughout.  Sensory: Sensory testing is intact to soft touch on all 4 extremities. No evidence of extinction is noted.  Coordination: Cerebellar testing reveals good finger-nose-finger and heel-to-shin bilaterally.  Gait and station: Gait is normal. Tandem gait is normal. Romberg is negative. No drift is seen.  Reflexes: Deep tendon reflexes are symmetric and normal bilaterally.    DIAGNOSTIC DATA (LABS, IMAGING, TESTING) - I reviewed patient records, labs, notes,  testing and imaging myself where available.  Lab Results  Component Value Date   HGB 13.6 08/19/2009   HCT 40.0 08/19/2009      Component Value Date/Time   NA 142 08/19/2009 0654   K 5.0 08/19/2009 0654   CL 107 08/19/2009 0654   GLUCOSE 97 08/19/2009 0654   BUN 16  08/19/2009 0654   CREATININE 0.8 08/19/2009 0654   No results found for: CHOL, HDL, LDLCALC, LDLDIRECT, TRIG, CHOLHDL No results found for: YHAJ8R No results found for: VITAMINB12 No results found for: TSH      No data to display               No data to display           ASSESSMENT AND PLAN  73 y.o. year old female  has a past medical history of Arthritis, BRCA gene mutation negative, Migraine, Mixed dyslipidemia, Plantar fasciitis, and Tremor. here with    Essential tremor - Plan: Primidone  level, CMP  Sharon Miller is doing well. She will continue primidone  50mg  in am and 100mg  at bedtime. Continue primidone  per PCP. We will update labs, today. She will continue to monitor BP and pulse. Healthy lifestyle habits encouraged. She will follow up with PCP as directed. She will return to see me in 1 year, sooner if needed. She verbalizes understanding and agreement with this plan.   Orders Placed This Encounter  Procedures   Primidone  level   CMP     Meds ordered this encounter  Medications   primidone  (MYSOLINE ) 50 MG tablet    Sig: Take 1 tablet (50 mg total) by mouth in the morning AND 2 tablets (100 mg total) at bedtime.    Dispense:  270 tablet    Refill:  3    Supervising Provider:   AHERN, ANTONIA B [[id number]]     Greig Forbes, MSN, FNP-C 12/10/2023, 11:00 AM  [facility] [address] [place], [place] [address] [phone]

## 2023-12-12 LAB — COMPREHENSIVE METABOLIC PANEL WITH GFR
ALT: 19 IU/L (ref 0–32)
AST: 19 IU/L (ref 0–40)
Albumin: 4.5 g/dL (ref 3.8–4.8)
Alkaline Phosphatase: 81 IU/L (ref 49–135)
BUN/Creatinine Ratio: 15 (ref 12–28)
BUN: 10 mg/dL (ref 8–27)
Bilirubin Total: 0.3 mg/dL (ref 0.0–1.2)
CO2: 22 mmol/L (ref 20–29)
Calcium: 9.7 mg/dL (ref 8.7–10.3)
Chloride: 101 mmol/L (ref 96–106)
Creatinine, Ser: 0.65 mg/dL (ref 0.57–1.00)
Globulin, Total: 2 g/dL (ref 1.5–4.5)
Glucose: 81 mg/dL (ref 70–99)
Potassium: 4.9 mmol/L (ref 3.5–5.2)
Sodium: 138 mmol/L (ref 134–144)
Total Protein: 6.5 g/dL (ref 6.0–8.5)
eGFR: 93 mL/min/1.73 (ref >59)

## 2023-12-12 LAB — PRIMIDONE, SERUM
Phenobarbital, Serum: 3 ug/mL — AB (ref 15–40)
Primidone Lvl: 4.5 ug/mL — AB (ref 5.0–12.0)

## 2023-12-15 ENCOUNTER — Ambulatory Visit: Payer: Self-pay | Admitting: Family Medicine

## 2023-12-27 DIAGNOSIS — R059 Cough, unspecified: Secondary | ICD-10-CM | POA: Diagnosis not present

## 2024-01-19 DIAGNOSIS — Z23 Encounter for immunization: Secondary | ICD-10-CM | POA: Diagnosis not present

## 2024-01-19 DIAGNOSIS — Z6825 Body mass index (BMI) 25.0-25.9, adult: Secondary | ICD-10-CM | POA: Diagnosis not present

## 2024-01-19 DIAGNOSIS — H6991 Unspecified Eustachian tube disorder, right ear: Secondary | ICD-10-CM | POA: Diagnosis not present

## 2024-02-11 DIAGNOSIS — N390 Urinary tract infection, site not specified: Secondary | ICD-10-CM | POA: Diagnosis not present

## 2024-02-11 DIAGNOSIS — R3914 Feeling of incomplete bladder emptying: Secondary | ICD-10-CM | POA: Diagnosis not present

## 2024-12-13 ENCOUNTER — Ambulatory Visit: Admitting: Family Medicine
